# Patient Record
Sex: Female | Born: 1990 | Race: White | Hispanic: No | Marital: Single | Smoking: Former smoker
Health system: Southern US, Community
[De-identification: ages and names within clinical notes are randomized; demographics above are authoritative.]

## PROBLEM LIST (undated history)

## (undated) DIAGNOSIS — T7840XA Allergy, unspecified, initial encounter: Secondary | ICD-10-CM

## (undated) DIAGNOSIS — F419 Anxiety disorder, unspecified: Secondary | ICD-10-CM

## (undated) DIAGNOSIS — F909 Attention-deficit hyperactivity disorder, unspecified type: Secondary | ICD-10-CM

## (undated) HISTORY — DX: Attention-deficit hyperactivity disorder, unspecified type: F90.9

## (undated) HISTORY — PX: TONSILLECTOMY: SUR1361

## (undated) HISTORY — DX: Anxiety disorder, unspecified: F41.9

## (undated) HISTORY — DX: Allergy, unspecified, initial encounter: T78.40XA

---
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
## 2012-02-06 NOTE — L&D Delivery Note (Signed)
Delivery Note  SVD viable female Apgars 8,9 over intact perineum.  Placenta delivered spontaneously intact with 3VC. Repair with 2-0 Chromic of post wall vag lac with good support and hemostasis noted and R/V exam confirms.  PH art was sent.  [place] cord blood was not done.  Mother and baby were doing well.  EBL 350cc  Candice Camp, MD

## 2012-04-13 IMAGING — CR RIGHT THUMB 2+V
1 series · 3 of 3 positions shown · non-contrast
Comparison: none

REASON FOR EXAM: pain in thumb Call Report to Dr Kornuta [phone]
COMMENTS:

PROCEDURE:     DXR - DXR THUMB RIGHT HAND (1ST DIGIT)  - July 05, 2009  [DATE]
RESULT:     No fracture, dislocation or other acute bony abnormality is
identified.

[Series 1: view not recorded · 0.17mm/px · 3 of 3 slices shown]
[im 1/3]
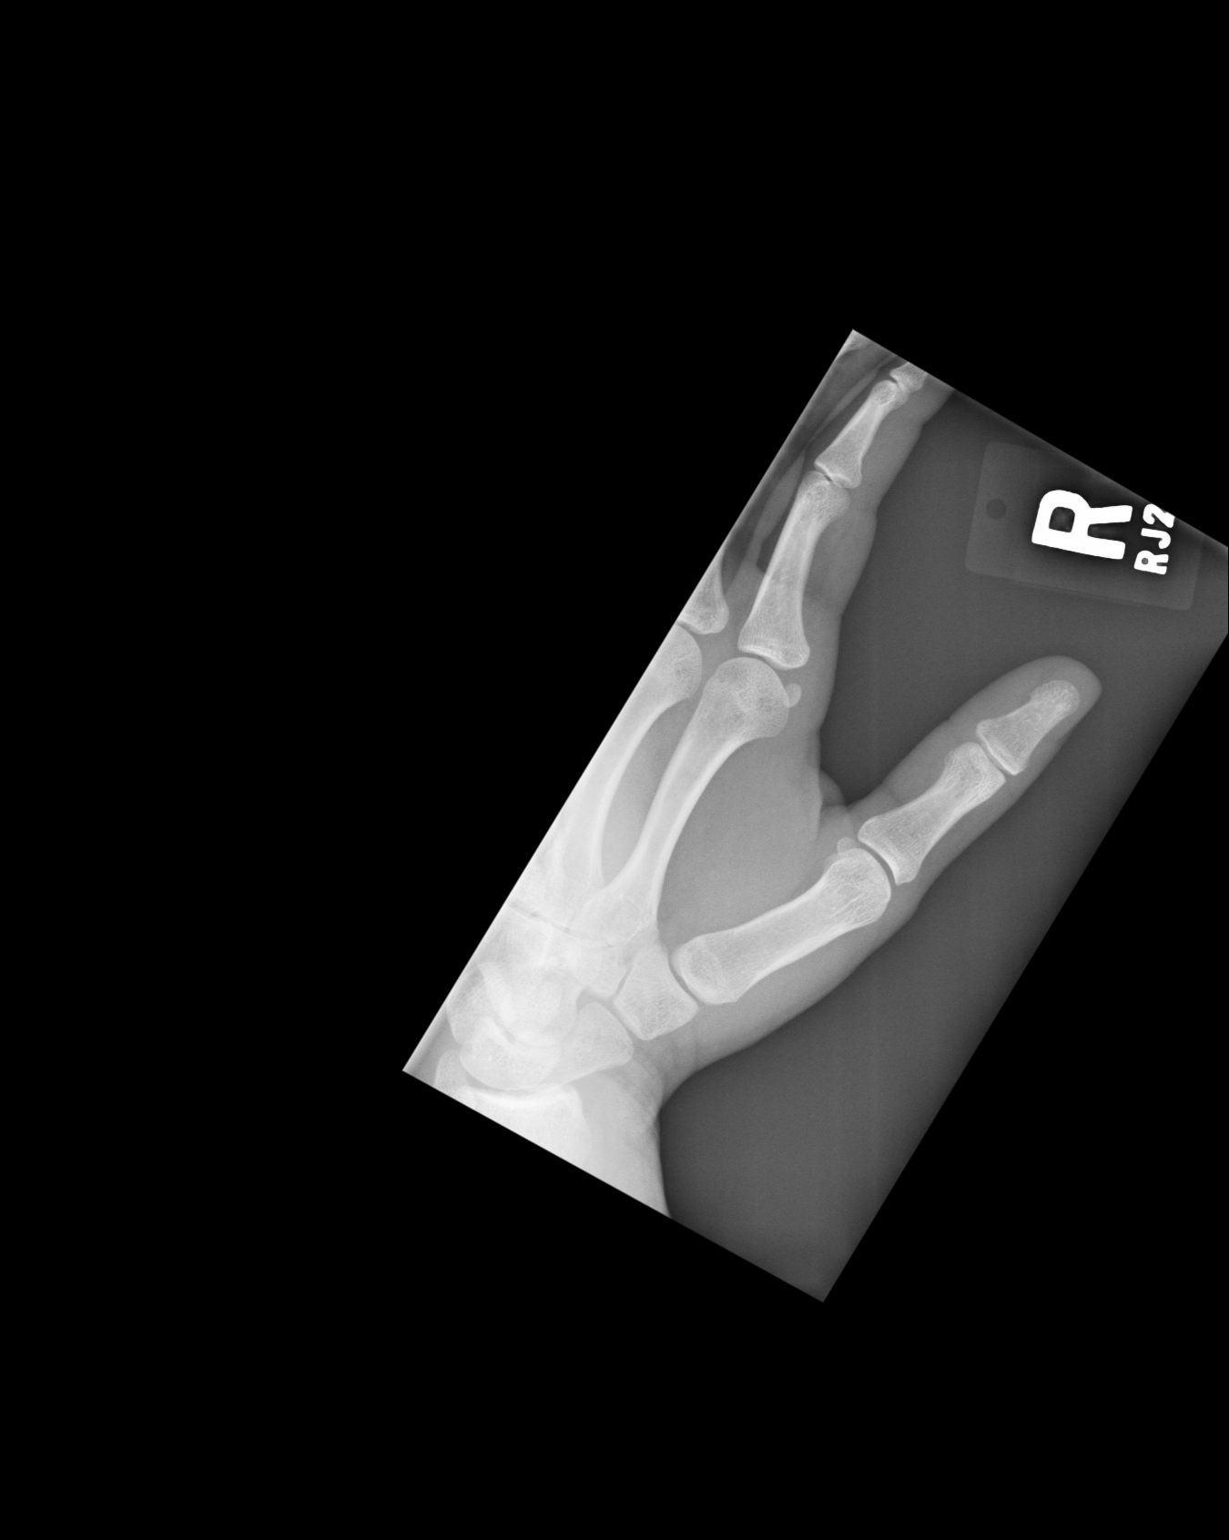
[im 2/3]
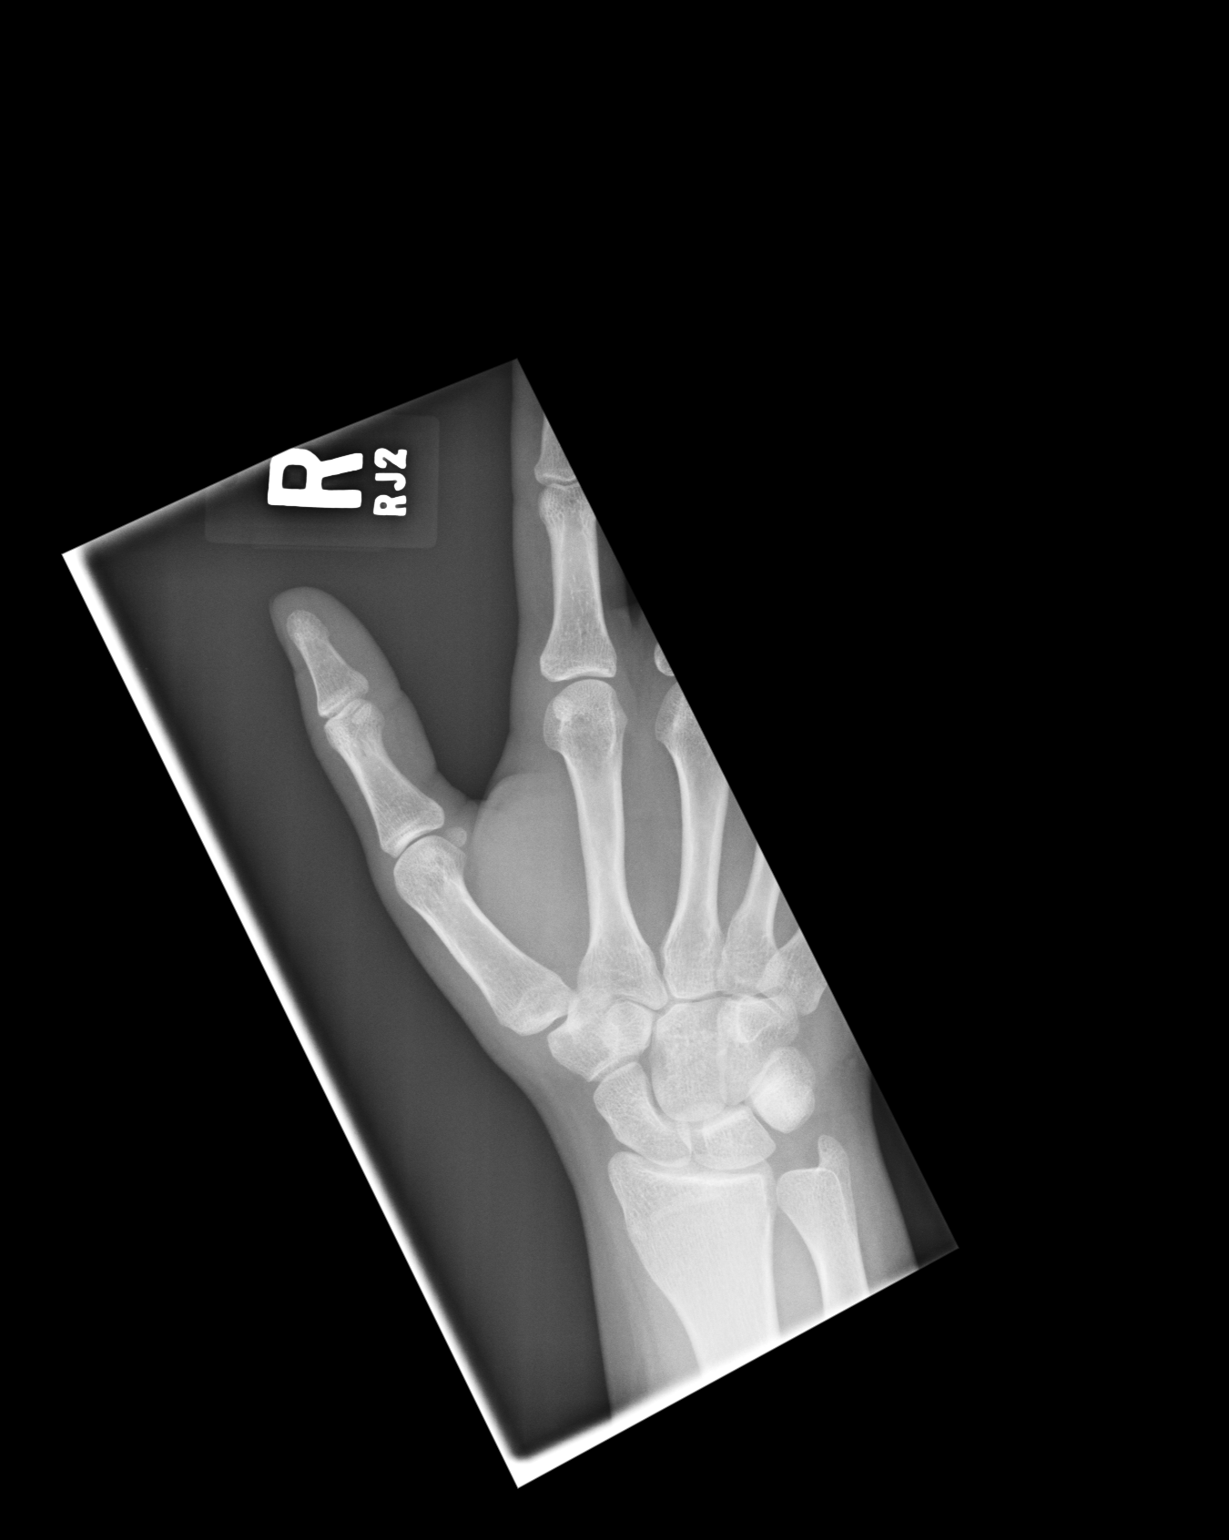
[im 3/3]
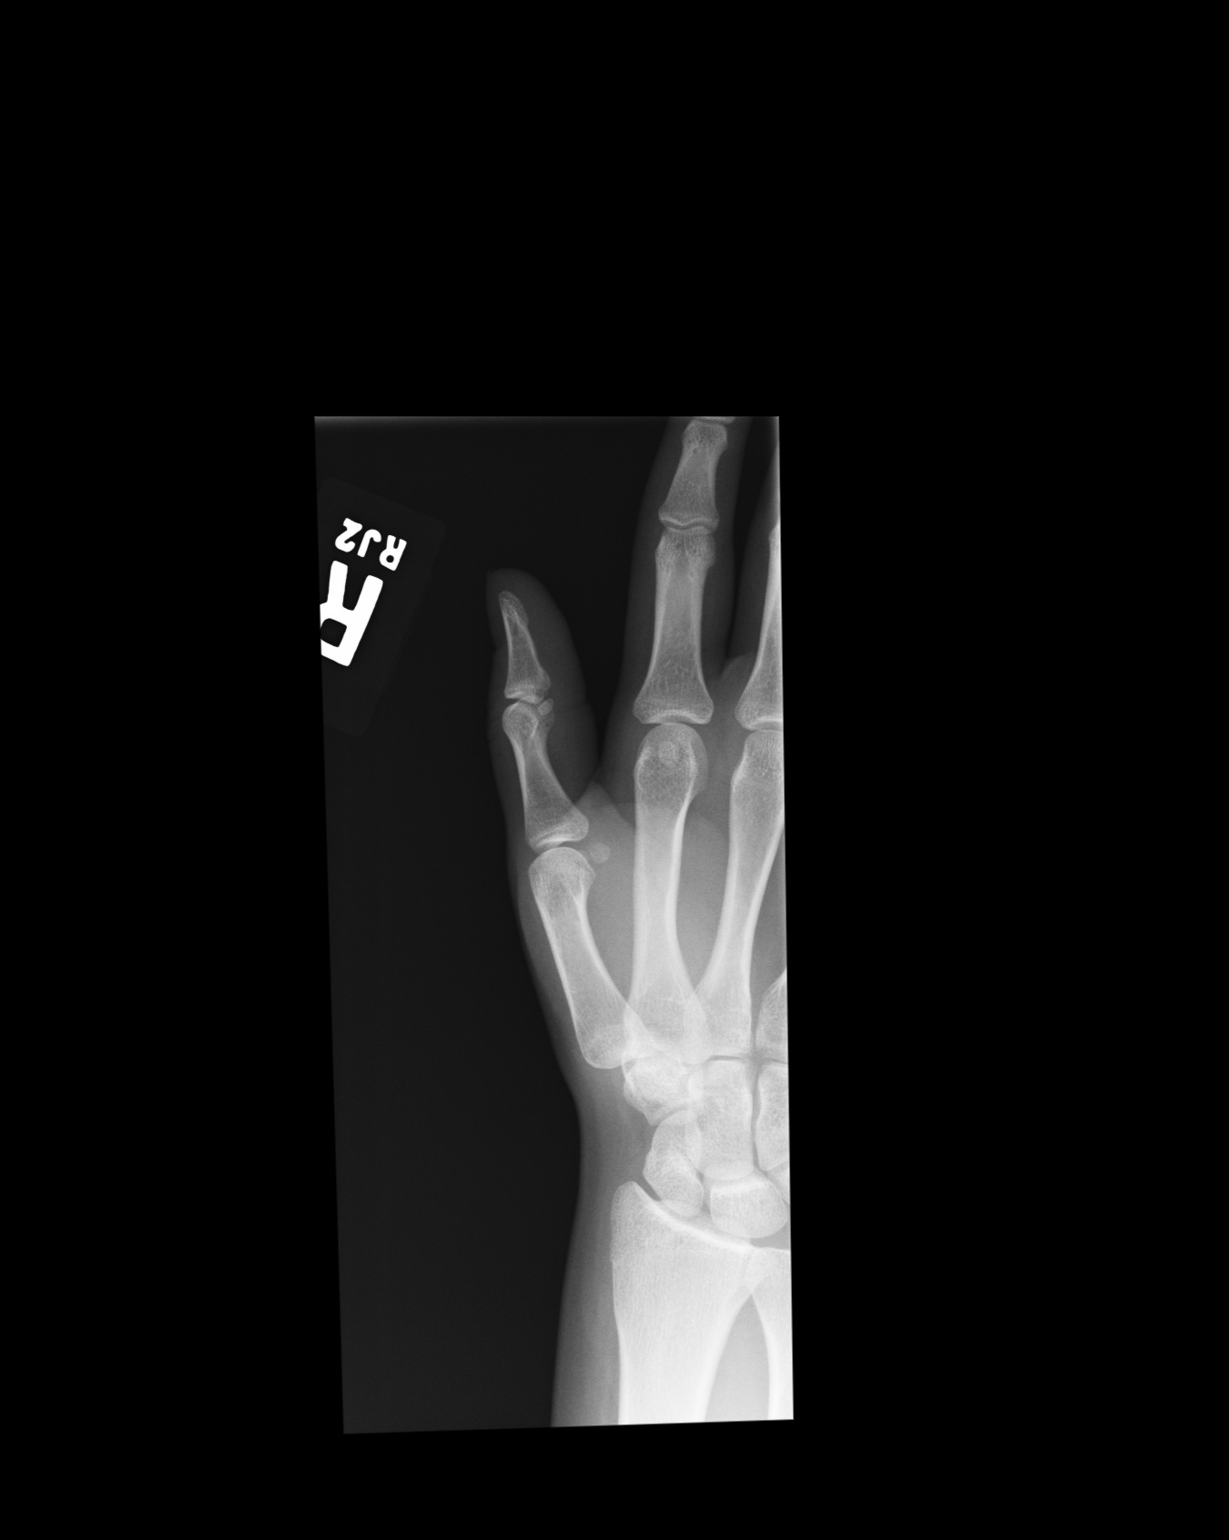

[3 of 3 positions shown; findings below may reference images not displayed]

IMPRESSION: 1. No significant osseous abnormalities are noted.
2. No soft tissue foreign body is seen area

## 2012-04-21 LAB — OB RESULTS CONSOLE GC/CHLAMYDIA
Chlamydia: NEGATIVE
Gonorrhea: NEGATIVE

## 2012-04-21 LAB — OB RESULTS CONSOLE RPR: RPR: NONREACTIVE

## 2012-04-21 LAB — OB RESULTS CONSOLE HIV ANTIBODY (ROUTINE TESTING): HIV: NONREACTIVE

## 2012-04-21 LAB — OB RESULTS CONSOLE HEPATITIS B SURFACE ANTIGEN: Hepatitis B Surface Ag: NEGATIVE

## 2012-10-27 LAB — OB RESULTS CONSOLE GBS: GBS: NEGATIVE

## 2012-11-13 ENCOUNTER — Encounter (HOSPITAL_COMMUNITY): Payer: Self-pay

## 2012-11-13 ENCOUNTER — Inpatient Hospital Stay (HOSPITAL_COMMUNITY)
Admission: AD | Admit: 2012-11-13 | Discharge: 2012-11-15 | DRG: 775 | Disposition: A | Discharge status: Home / Self Care | Payer: Medicaid Other | Source: Ambulatory Visit | Attending: Obstetrics & Gynecology | Admitting: Obstetrics & Gynecology

## 2012-11-13 DIAGNOSIS — Z87891 Personal history of nicotine dependence: Secondary | ICD-10-CM

## 2012-11-13 LAB — COMPREHENSIVE METABOLIC PANEL
ALT: 13 U/L (ref 0–35)
AST: 22 U/L (ref 0–37)
Alkaline Phosphatase: 247 U/L — ABNORMAL HIGH (ref 39–117)
CO2: 20 mEq/L (ref 19–32)
Calcium: 9.5 mg/dL (ref 8.4–10.5)
Chloride: 101 mEq/L (ref 96–112)
Creatinine, Ser: 0.66 mg/dL (ref 0.50–1.10)
GFR calc non Af Amer: >90 mL/min (ref >90)
Potassium: 4.4 mEq/L (ref 3.5–5.1)
Sodium: 136 mEq/L (ref 135–145)
Total Protein: 7.1 g/dL (ref 6.0–8.3)

## 2012-11-13 LAB — CBC
HCT: 39.2 % (ref 36.0–46.0)
MCH: 27.4 pg (ref 26.0–34.0)
MCV: 82 fL (ref 78.0–100.0)
Platelets: 205 10*3/uL (ref 150–400)
RBC: 4.78 MIL/uL (ref 3.87–5.11)
RDW: 13.6 % (ref 11.5–15.5)
WBC: 13.5 10*3/uL — ABNORMAL HIGH (ref 4.0–10.5)

## 2012-11-13 LAB — URINALYSIS, ROUTINE W REFLEX MICROSCOPIC
Bilirubin Urine: NEGATIVE
Glucose, UA: NEGATIVE mg/dL
Hgb urine dipstick: NEGATIVE
Ketones, ur: NEGATIVE mg/dL
Nitrite: NEGATIVE
Protein, ur: NEGATIVE mg/dL
Specific Gravity, Urine: 1.025 (ref 1.005–1.030)
Urobilinogen, UA: 0.2 mg/dL (ref 0.0–1.0)
pH: 6.5 (ref 5.0–8.0)

## 2012-11-13 LAB — URINE MICROSCOPIC-ADD ON

## 2012-11-13 LAB — LACTATE DEHYDROGENASE: LDH: 259 U/L — ABNORMAL HIGH (ref 94–250)

## 2012-11-13 LAB — ABO/RH: ABO/RH(D): O POS

## 2012-11-13 LAB — TYPE AND SCREEN
ABO/RH(D): O POS
Antibody Screen: NEGATIVE

## 2012-11-13 MED ORDER — OXYCODONE-ACETAMINOPHEN 5-325 MG PO TABS: 1.0000 | ORAL_TABLET | ORAL | Status: DC | PRN

## 2012-11-13 MED ORDER — OXYTOCIN 40 UNITS IN LACTATED RINGERS INFUSION - SIMPLE MED
62.5000 mL/h | INTRAVENOUS | Status: DC
  Administered 2012-11-13: 62.5 mL/h via INTRAVENOUS
  Filled 2012-11-13: qty 1000

## 2012-11-13 MED ORDER — DIPHENHYDRAMINE HCL 25 MG PO CAPS: 25.0000 mg | ORAL_CAPSULE | Freq: Four times a day (QID) | ORAL | Status: DC | PRN

## 2012-11-13 MED ORDER — SIMETHICONE 80 MG PO CHEW: 80.0000 mg | CHEWABLE_TABLET | ORAL | Status: DC | PRN

## 2012-11-13 MED ORDER — FLEET ENEMA 7-19 GM/118ML RE ENEM: 1.0000 | ENEMA | RECTAL | Status: DC | PRN

## 2012-11-13 MED ORDER — MEDROXYPROGESTERONE ACETATE 150 MG/ML IM SUSP: 150.0000 mg | INTRAMUSCULAR | Status: DC | PRN

## 2012-11-13 MED ORDER — LANOLIN HYDROUS EX OINT: TOPICAL_OINTMENT | CUTANEOUS | Status: DC | PRN

## 2012-11-13 MED ORDER — MEASLES, MUMPS & RUBELLA VAC ~~LOC~~ INJ
0.5000 mL | INJECTION | Freq: Once | SUBCUTANEOUS | Status: DC
  Filled 2012-11-13: qty 0.5

## 2012-11-13 MED ORDER — LACTATED RINGERS IV SOLN: 500.0000 mL | INTRAVENOUS | Status: DC | PRN

## 2012-11-13 MED ORDER — TETANUS-DIPHTH-ACELL PERTUSSIS 5-2.5-18.5 LF-MCG/0.5 IM SUSP: 0.5000 mL | Freq: Once | INTRAMUSCULAR | Status: DC

## 2012-11-13 MED ORDER — EPHEDRINE 5 MG/ML INJ
10.0000 mg | INTRAVENOUS | Status: DC | PRN
  Filled 2012-11-13: qty 2

## 2012-11-13 MED ORDER — PHENYLEPHRINE 40 MCG/ML (10ML) SYRINGE FOR IV PUSH (FOR BLOOD PRESSURE SUPPORT)
80.0000 ug | PREFILLED_SYRINGE | INTRAVENOUS | Status: DC | PRN
  Filled 2012-11-13: qty 2

## 2012-11-13 MED ORDER — DIBUCAINE 1 % RE OINT: 1.0000 "application " | TOPICAL_OINTMENT | RECTAL | Status: DC | PRN

## 2012-11-13 MED ORDER — ONDANSETRON HCL 4 MG PO TABS: 4.0000 mg | ORAL_TABLET | ORAL | Status: DC | PRN

## 2012-11-13 MED ORDER — DIPHENHYDRAMINE HCL 50 MG/ML IJ SOLN: 12.5000 mg | INTRAMUSCULAR | Status: DC | PRN

## 2012-11-13 MED ORDER — LIDOCAINE HCL (PF) 1 % IJ SOLN
30.0000 mL | INTRAMUSCULAR | Status: DC | PRN
  Filled 2012-11-13 (×2): qty 30

## 2012-11-13 MED ORDER — ZOLPIDEM TARTRATE 5 MG PO TABS: 5.0000 mg | ORAL_TABLET | Freq: Every evening | ORAL | Status: DC | PRN

## 2012-11-13 MED ORDER — ACETAMINOPHEN 325 MG PO TABS: 650.0000 mg | ORAL_TABLET | ORAL | Status: DC | PRN

## 2012-11-13 MED ORDER — ONDANSETRON HCL 4 MG/2ML IJ SOLN: 4.0000 mg | INTRAMUSCULAR | Status: DC | PRN

## 2012-11-13 MED ORDER — LACTATED RINGERS IV SOLN: 500.0000 mL | Freq: Once | INTRAVENOUS | Status: DC

## 2012-11-13 MED ORDER — OXYTOCIN BOLUS FROM INFUSION: 500.0000 mL | INTRAVENOUS | Status: DC

## 2012-11-13 MED ORDER — WITCH HAZEL-GLYCERIN EX PADS: 1.0000 "application " | MEDICATED_PAD | CUTANEOUS | Status: DC | PRN

## 2012-11-13 MED ORDER — SENNOSIDES-DOCUSATE SODIUM 8.6-50 MG PO TABS
2.0000 | ORAL_TABLET | ORAL | Status: DC
  Administered 2012-11-13 – 2012-11-14 (×2): 2 via ORAL
  Filled 2012-11-13 (×2): qty 2

## 2012-11-13 MED ORDER — EPHEDRINE 5 MG/ML INJ
10.0000 mg | INTRAVENOUS | Status: DC | PRN
  Filled 2012-11-13: qty 4
  Filled 2012-11-13: qty 2

## 2012-11-13 MED ORDER — PRENATAL MULTIVITAMIN CH
1.0000 | ORAL_TABLET | Freq: Every day | ORAL | Status: DC
  Administered 2012-11-14 – 2012-11-15 (×2): 1 via ORAL
  Filled 2012-11-13 (×2): qty 1

## 2012-11-13 MED ORDER — LACTATED RINGERS IV SOLN
INTRAVENOUS | Status: DC
  Administered 2012-11-13: 06:00:00 via INTRAVENOUS

## 2012-11-13 MED ORDER — ONDANSETRON HCL 4 MG/2ML IJ SOLN: 4.0000 mg | Freq: Four times a day (QID) | INTRAMUSCULAR | Status: DC | PRN

## 2012-11-13 MED ORDER — IBUPROFEN 600 MG PO TABS
600.0000 mg | ORAL_TABLET | Freq: Four times a day (QID) | ORAL | Status: DC
  Administered 2012-11-13 – 2012-11-15 (×8): 600 mg via ORAL
  Filled 2012-11-13 (×9): qty 1

## 2012-11-13 MED ORDER — FENTANYL 2.5 MCG/ML BUPIVACAINE 1/10 % EPIDURAL INFUSION (WH - ANES)
14.0000 mL/h | INTRAMUSCULAR | Status: DC | PRN
  Administered 2012-11-13: 14 mL/h via EPIDURAL
  Filled 2012-11-13 (×2): qty 125

## 2012-11-13 MED ORDER — BENZOCAINE-MENTHOL 20-0.5 % EX AERO
1.0000 "application " | INHALATION_SPRAY | CUTANEOUS | Status: DC | PRN
  Administered 2012-11-14: 1 via TOPICAL
  Filled 2012-11-13: qty 56

## 2012-11-13 MED ORDER — IBUPROFEN 600 MG PO TABS: 600.0000 mg | ORAL_TABLET | Freq: Four times a day (QID) | ORAL | Status: DC | PRN

## 2012-11-13 MED ORDER — CITRIC ACID-SODIUM CITRATE 334-500 MG/5ML PO SOLN: 30.0000 mL | ORAL | Status: DC | PRN

## 2012-11-13 MED ORDER — PHENYLEPHRINE 40 MCG/ML (10ML) SYRINGE FOR IV PUSH (FOR BLOOD PRESSURE SUPPORT)
80.0000 ug | PREFILLED_SYRINGE | INTRAVENOUS | Status: DC | PRN
  Filled 2012-11-13: qty 2
  Filled 2012-11-13: qty 5

## 2012-11-13 NOTE — H&P (Signed)
Felicia Shepherd is a 22 y.o. female presenting for labor.  CTX began at 0030 today.  No LOF or VB.  +FM.  GBS negative.  No antepartum complications.  Comfortable with epidural.  Maternal Medical History:  Reason for admission: Contractions.   Contractions: Onset was 6-12 hours ago.   Frequency: regular.   Perceived severity is moderate.    Fetal activity: Perceived fetal activity is normal.   Last perceived fetal movement was within the past hour.    Prenatal complications: no prenatal complications Prenatal Complications - Diabetes: none.    OB History   Grav Para Term Preterm Abortions TAB SAB Ect Mult Living   1              History reviewed. No pertinent past medical history. Past Surgical History  Procedure Laterality Date  . Tonsillectomy     Family History: family history is not on file. Social History:  reports that she has quit smoking. She does not have any smokeless tobacco history on file. She reports that she does not drink alcohol or use illicit drugs.   Prenatal Transfer Tool  Maternal Diabetes: No Genetic Screening: Normal Maternal Ultrasounds/Referrals: Normal Fetal Ultrasounds or other Referrals:  None Maternal Substance Abuse:  No Significant Maternal Medications:  None Significant Maternal Lab Results:  Lab values include: Group B Strep negative Other Comments:  None  ROS  Dilation: 7 Effacement (%): 80 Station: -2;-1 Exam by:: LCarpenter,Rn Blood pressure 133/74, pulse 78, temperature 98.2 F (36.8 C), temperature source Oral, resp. rate 18, height 5\' 1"  (1.549 m), weight 207 lb 6.4 oz (94.076 kg), SpO2 100.00%. Maternal Exam:  Uterine Assessment: Contraction strength is moderate.  Contraction frequency is regular.   Abdomen: Patient reports no abdominal tenderness. Fundal height is c/w dates.   Estimated fetal weight is 7#8.   Fetal presentation: vertex  Introitus: Normal vulva. Normal vagina.  Pelvis: adequate for delivery.   Cervix: Cervix  evaluated by digital exam.     Physical Exam  Constitutional: She is oriented to person, place, and time. She appears well-developed and well-nourished.  GI: Soft. There is no rebound and no guarding.  Neurological: She is alert and oriented to person, place, and time.  Skin: Skin is warm and dry.  Psychiatric: She has a normal mood and affect. Her behavior is normal.    Prenatal labs: ABO, Rh: O/Positive/-- (03/17 0000) Antibody: Negative (03/17 0000) Rubella: Immune (03/17 0000) RPR: Nonreactive (03/17 0000)  HBsAg: Negative (03/17 0000)  HIV: Non-reactive (03/17 0000)  GBS:     Assessment/Plan: 22yo G1 at [redacted]w[redacted]d with labor -GBS negative -Anticipate SVD   Felicia Shepherd 11/13/2012, 6:33 AM

## 2012-11-13 NOTE — MAU Note (Signed)
Contractions every 4-6 mins since 930 pm tonight. Denies LOF or VB. Positive fetal movement. Was dilated 2.5 cm in office on Monday.

## 2012-11-13 NOTE — Progress Notes (Signed)
Epidural catheter removed intact with blue tip noted; pt tolerated well.  Bandaid applied to site, which is clean and unremarkable.

## 2012-11-13 NOTE — MAU Note (Signed)
SAYS SHE STARTED HURT BAD AT 0030.  VE IN OFFICE ON Monday-   2-3 CM- DR HOLLAND.     DENIES HSV AND  MRSA.

## 2012-11-13 NOTE — Progress Notes (Signed)
Called to see patient secondary to severe variable decelerations x 3 despite repositioning.  AROM with clear fluid.  SVE 7/90/0.  IUPC and FSE placed.  Improvement in FHT.  Will closely monitor.

## 2012-11-14 ENCOUNTER — Encounter (HOSPITAL_COMMUNITY): Payer: Self-pay | Admitting: Anesthesiology

## 2012-11-14 LAB — CBC
HCT: 34.6 % — ABNORMAL LOW (ref 36.0–46.0)
MCH: 27.6 pg (ref 26.0–34.0)
MCHC: 33.2 g/dL (ref 30.0–36.0)
MCV: 83 fL (ref 78.0–100.0)
RBC: 4.17 MIL/uL (ref 3.87–5.11)
RDW: 13.9 % (ref 11.5–15.5)

## 2012-11-14 NOTE — Progress Notes (Signed)
Post Partum Day 1 Subjective: no complaints, up ad lib, voiding and baby in NICU for hypoglycemia  Objective: Blood pressure 130/74, pulse 71, temperature 98.2 F (36.8 C), temperature source Oral, resp. rate 20, height 5\' 1"  (1.549 m), weight 207 lb 6.4 oz (94.076 kg), SpO2 100.00%, unknown if currently breastfeeding.  Physical Exam:  General: alert and cooperative Lochia: appropriate Uterine Fundus: firm Incision: perineum intact DVT Evaluation: No evidence of DVT seen on physical exam. Negative Homan's sign. No cords or calf tenderness. No significant calf/ankle edema.   Recent Labs  11/13/12 0300 11/14/12 0525  HGB 13.1 11.5*  HCT 39.2 34.6*    Assessment/Plan: Plan for discharge tomorrow and Circumcision prior to discharge   LOS: 1 day   Terral Cooks G 11/14/2012, 9:13 AM

## 2012-11-14 NOTE — Anesthesia Postprocedure Evaluation (Signed)
  Anesthesia Post-op Note  Patient: Felicia Shepherd  Procedure(s) Performed: * No procedures listed *  Patient Location: Mother/Baby  Anesthesia Type:Epidural  Level of Consciousness: awake, alert , oriented and patient cooperative  Airway and Oxygen Therapy: Patient Spontanous Breathing  Post-op Pain: mild  Post-op Assessment: Patient's Cardiovascular Status Stable and Respiratory Function Stable  Post-op Vital Signs: stable  Complications: No apparent anesthesia complications

## 2012-11-14 NOTE — Lactation Note (Addendum)
This note was copied from the chart of Felicia Shepherd. Lactation Consultation Note   Initial consult with this mom and baby, in NICU, at 24 hours post partum. Mom has been pumping every 3 hours, and expressing 1-2 mls each time(colostru). I assisted her with latching  Mosetta Putt for the first time, in cross cradle hold. He was fussy, and mom's nipples were inverting with compression, and too large for baby's mouth. I then tried a 20 nipple shield, which is small for mom, but allowed the baby to latch, and had some intermittent suckles. He transferred 1 ml of EBM that I placed in the shield. I shoed mom how to apply the nipple shield, and gave her an extra 20 and also a 24. The 24 fits mom wel, but I think will gag Mosetta Putt. Mom is gaing to try and latch without a shield first.     I later worked with mom in her hospital room. I reviewed the NICU booklet with her, loaned her a [employer] DEP and instructed her in it's use, reviewed hand expression, increased her to 27 flanges, and gave mom comfort gels for tender, but intact, nipples. I reviewed part care with mom, and showed her how to use the [employer] DEP for home. I also gave mom a manual hand pump, and instructed her in it's use. I advised mom to use this prior to latching Mosetta Putt, to better evert her nipples. Mom knows to call for questions/concerns, and to call [facility] for an appointment to get a DEP next weeks.  Patient Name: Felicia Abryanna Musolino [id number] Date: 11/14/2012 Reason for consult: Initial assessment;NICU baby;Infant < 6lbs (SGA term baby)   Maternal Data Formula Feeding for Exclusion: Yes (baby in NICU) Infant to breast within first hour of birth: Yes Has patient been taught Hand Expression?: Yes Does the patient have breastfeeding experience prior to this delivery?: No  Feeding Feeding Type: Breast Milk Nipple Type: Slow - flow Length of feed: 15 min  LATCH Score/Interventions Latch: Repeated attempts needed to sustain latch, nipple held in mouth  throughout feeding, stimulation needed to elicit sucking reflex. Intervention(s): Adjust position;Assist with latch (20 nipple shiled used to maintain and initiate suckles)  Audible Swallowing: None  Type of Nipple: Flat (were flattening with conpressionm firm tissue,large for baby's mouth) Intervention(s): Double electric pump;Hand pump  Comfort (Breast/Nipple): Filling, red/small blisters or bruises, mild/mod discomfort  Problem noted: Mild/Moderate discomfort Interventions (Mild/moderate discomfort): Comfort gels  Hold (Positioning): Assistance needed to correctly position infant at breast and maintain latch.  LATCH Score: 4  Lactation Tools Discussed/Used Tools: Nipple Shields Nipple shield size: 20;24 WIC Program: Yes Pump Review: Setup, frequency, and cleaning;Milk Storage;Other (comment) (hand expression, NICU booklet on EBm in NICU) Initiated by:: bedside rn within 6 hours of baby going to NICU Date initiated:: 11/13/12   Consult Status Consult Status: Follow-up Date: 11/15/12 Follow-up type: In-patient    Alfred Levins 11/14/2012, 6:22 PM

## 2012-11-15 MED ORDER — IBUPROFEN 600 MG PO TABS: 600.0000 mg | ORAL_TABLET | Freq: Four times a day (QID) | ORAL | Status: DC

## 2012-11-15 NOTE — Discharge Summary (Signed)
Obstetric Discharge Summary Reason for Admission: onset of labor Prenatal Procedures: ultrasound Intrapartum Procedures: spontaneous vaginal delivery Postpartum Procedures: none Complications-Operative and Postpartum: none Hemoglobin  Date Value Range Status  11/14/2012 11.5* 12.0 - 15.0 g/dL Final     HCT  Date Value Range Status  11/14/2012 34.6* 36.0 - 46.0 % Final    Physical Exam:  General: alert and cooperative Lochia: appropriate Uterine Fundus: firm Incision: n/a DVT Evaluation: No evidence of DVT seen on physical exam.  Discharge Diagnoses: Preterm delivery  Discharge Information: Date: 11/15/2012 Activity: pelvic rest Diet: routine Medications: PNV and Ibuprofen Condition: stable Instructions: refer to practice specific booklet Discharge to: home Follow-up Information   Schedule an appointment as soon as possible for a visit in 6 weeks to follow up.      Newborn Data: Live born female  Birth Weight: 5 lb 7.3 oz (2475 g) APGAR: 8, 9  Home with mother.  Felicia Shepherd 11/15/2012, 10:45 AM

## 2012-11-18 ENCOUNTER — Ambulatory Visit: Payer: Self-pay

## 2012-11-18 NOTE — Lactation Note (Signed)
This note was copied from the chart of Felicia Shepherd. Lactation Consultation Note      Follow up brief consult with this mom of a NICU baby, term at 31 3/[redacted] weeks gestation , but small at 5 1/2 pounds. He is taking all his milk by bottle now, and should be discharged to home in the next few days. Mom does not want to try latching now - just pump and bottle, but knows to call for an outpatient consult when she decides to try latching him. Mom reports some nipple tenderness at the end of pumping. I gave her size 30 flanges and adivsed her to try them. Mom knows to call for questions/concerns.  Patient Name: Felicia Gayleen Sholtz [id number] Date: 11/18/2012 Reason for consult: Follow-up assessment;NICU baby   Maternal Data    Feeding Feeding Type: Bottle Fed - Breast Milk Nipple Type: Slow - flow Length of feed: 20 min  LATCH Score/Interventions                      Lactation Tools Discussed/Used Tools: Flanges Flange Size: 30 (increased mom to 30's)   Consult Status Consult Status: PRN Follow-up type:  (in NICU)    Alfred Levins 11/18/2012, 11:06 AM

## 2012-11-20 ENCOUNTER — Ambulatory Visit: Payer: Self-pay

## 2012-11-20 NOTE — Lactation Note (Signed)
This note was copied from the chart of Felicia Shepherd. Lactation Consultation Note     Follow up consult with this mom and baby, now 7 days and 39 5/7 weeks corrected gestation.  I assisted mom with latching Mosetta Putt. I tried at first in football hold, without nipple shiled He would latch but not maintain. Strong cues, obviously hungry. I then tried a 24 nipple shiled with SNS under the shield. He now suckled well intermittently, with audible swallows but was sleepy . I told mom she can do this once home as often during the day as she and baby have the time and energy for. I told her to continue pumping, to comfort now, and that shw eill still probably be mostly bottle feeding at first. Mom will call early next week to schedule an outpatient   Patient Name: Felicia Kamara [name] Date: 11/20/2012 Reason for consult: Follow-up assessment;NICU baby   Maternal Data    Feeding Feeding Type: Breast Fed Nipple Type: Slow - flow Length of feed: 5 min  LATCH Score/Interventions Latch: Repeated attempts needed to sustain latch, nipple held in mouth throughout feeding, stimulation needed to elicit sucking reflex. (24 nipple shile with SNS under shield with EBM was used to maintain latch and eliciit suck) Intervention(s): Skin to skin;Teach feeding cues;Waking techniques Intervention(s): Adjust position;Assist with latch;Breast massage;Breast compression  Audible Swallowing: Spontaneous and intermittent Intervention(s): Skin to skin;Hand expression  Type of Nipple: Everted at rest and after stimulation  Comfort (Breast/Nipple): Soft / non-tender     Hold (Positioning): Assistance needed to correctly position infant at breast and maintain latch. Intervention(s): Breastfeeding basics reviewed;Support Pillows;Position options;Skin to skin  LATCH Score: 8  Lactation Tools Discussed/Used Nipple shield size: 24 (showed mom how to apply)   Consult Status Consult Status: Follow-up Follow-up  type:  (prn in NICU)    Alfred Levins 11/20/2012, 4:13 PM

## 2013-12-07 ENCOUNTER — Encounter (HOSPITAL_COMMUNITY): Payer: Self-pay

## 2014-08-17 ENCOUNTER — Ambulatory Visit: Payer: Self-pay | Admitting: Family Medicine

## 2014-10-05 ENCOUNTER — Other Ambulatory Visit: Payer: Self-pay | Admitting: Family Medicine

## 2014-10-05 ENCOUNTER — Ambulatory Visit: Payer: Self-pay | Admitting: Family Medicine

## 2014-10-05 MED ORDER — AMPHETAMINE-DEXTROAMPHETAMINE 5 MG PO TABS: 5.0000 mg | ORAL_TABLET | Freq: Every day | ORAL | Status: DC

## 2014-10-05 MED ORDER — AMPHETAMINE-DEXTROAMPHETAMINE 10 MG PO TABS: 10.0000 mg | ORAL_TABLET | Freq: Every morning | ORAL | Status: DC

## 2014-10-05 NOTE — Telephone Encounter (Signed)
Medication has been refilled and patient has picked up prescription

## 2014-10-12 ENCOUNTER — Encounter: Payer: Self-pay | Admitting: Family Medicine

## 2014-10-12 ENCOUNTER — Ambulatory Visit (INDEPENDENT_AMBULATORY_CARE_PROVIDER_SITE_OTHER): Payer: 59 | Admitting: Family Medicine

## 2014-10-12 VITALS — BP 120/73 | HR 75 | Temp 98.5°F | Resp 18 | Ht 63.0 in | Wt 168.4 lb

## 2014-10-12 DIAGNOSIS — F900 Attention-deficit hyperactivity disorder, predominantly inattentive type: Secondary | ICD-10-CM

## 2014-10-12 DIAGNOSIS — Z134 Encounter for screening for unspecified developmental delays: Secondary | ICD-10-CM | POA: Insufficient documentation

## 2014-10-12 DIAGNOSIS — Z1389 Encounter for screening for other disorder: Secondary | ICD-10-CM | POA: Insufficient documentation

## 2014-10-12 MED ORDER — AMPHETAMINE-DEXTROAMPHETAMINE 5 MG PO TABS: 5.0000 mg | ORAL_TABLET | Freq: Every day | ORAL | Status: DC

## 2014-10-12 MED ORDER — AMPHETAMINE-DEXTROAMPHETAMINE 10 MG PO TABS: 10.0000 mg | ORAL_TABLET | Freq: Every morning | ORAL | Status: DC

## 2014-10-12 NOTE — Progress Notes (Signed)
Name: Felicia Shepherd   MRN: [id number]    DOB: September 01, 1990   Date:10/12/2014       Progress Note  Subjective  Chief Complaint  Chief Complaint  Patient presents with  . Medication Refill    Adderall 5 mg  . ADHD    HPI Pt. Is here for Attention Deficit Disorder evaluation and  medication refill. She has difficulty focusing and completing tasks and works as a Runner, broadcasting/film/video in [place]. She is on Adderall 10 mg in AM and 5 mg in PM, which seems to help with above-mentioned symptoms. No reported side effects.  Past Medical History  Diagnosis Date  . Allergy     Past Surgical History  Procedure Laterality Date  . Tonsillectomy    . Tonsillectomy  2009  . Adenoidectomy  2009    Family History  Problem Relation Age of Onset  . Diabetes Maternal Grandmother   . Diabetes Maternal Grandfather   . Heart attack Maternal Grandfather     Social History   Social History  . Marital Status: Single    Spouse Name: N/A  . Number of Children: N/A  . Years of Education: N/A   Occupational History  . Not on file.   Social History Main Topics  . Smoking status: Former Games developer  . Smokeless tobacco: Not on file  . Alcohol Use: No  . Drug Use: No  . Sexual Activity: Not on file   Other Topics Concern  . Not on file   Social History Narrative     Current outpatient prescriptions:  .  amphetamine-dextroamphetamine (ADDERALL) 10 MG tablet, Take 1 tablet (10 mg total) by mouth every morning., Disp: 10 tablet, Rfl: 0 .  amphetamine-dextroamphetamine (ADDERALL) 5 MG tablet, Take 1 tablet (5 mg total) by mouth daily at 2 PM daily at 2 PM., Disp: 10 tablet, Rfl: 0 .  ibuprofen (ADVIL,MOTRIN) 600 MG tablet, Take 1 tablet (600 mg total) by mouth every 6 (six) hours., Disp: 30 tablet, Rfl: 0 .  Multiple Vitamin (MULTIVITAMIN) tablet, Take 1 tablet by mouth daily., Disp: , Rfl:   Allergies  Allergen Reactions  . Keflex [Cephalexin] Hives and Other (See Comments)     Review of Systems   Constitutional: Negative for weight loss.  Psychiatric/Behavioral: The patient is not nervous/anxious and does not have insomnia.    Objective  Filed Vitals:   10/12/14 0922  BP: 120/73  Pulse: 75  Temp: 98.5 F (36.9 C)  TempSrc: Oral  Resp: 18  Height:  (1.6 m)  Weight: 168 lb 6.4 oz (76.386 kg)  SpO2: 98%    Physical Exam  Constitutional: She is oriented to person, place, and time and well-developed, well-nourished, and in no distress.  Cardiovascular: Normal rate and regular rhythm.   Pulmonary/Chest: Effort normal and breath sounds normal.  Neurological: She is alert and oriented to person, place, and time.  Psychiatric: Memory, affect and judgment normal.  Nursing note and vitals reviewed.  Assessment & Plan  1. Attention-deficit hyperactivity disorder, predominantly inattentive type Refills for Adderall provided. Patient is doing well on the current dosage and with no apparent adverse effects. Follow-up in 3 months. - amphetamine-dextroamphetamine (ADDERALL) 5 MG tablet; Take 1 tablet (5 mg total) by mouth daily at 2 PM daily at 2 PM.  Dispense: 30 tablet; Refill: 0 - amphetamine-dextroamphetamine (ADDERALL) 10 MG tablet; Take 1 tablet (10 mg total) by mouth every morning.  Dispense: 30 tablet; Refill: 0 - amphetamine-dextroamphetamine (ADDERALL) 5 MG tablet; Take  1 tablet (5 mg total) by mouth daily at 2 PM daily at 2 PM.  Dispense: 30 tablet; Refill: 0   Coda Mathey Asad A. Fay[facility] 10/12/2014 9:50 AM

## 2015-01-11 ENCOUNTER — Ambulatory Visit: Payer: 59 | Admitting: Family Medicine

## 2015-03-10 ENCOUNTER — Ambulatory Visit (INDEPENDENT_AMBULATORY_CARE_PROVIDER_SITE_OTHER): Payer: 59 | Admitting: Family Medicine

## 2015-03-10 ENCOUNTER — Encounter: Payer: Self-pay | Admitting: Family Medicine

## 2015-03-10 VITALS — BP 112/74 | HR 93 | Temp 98.3°F | Resp 14 | Ht 63.0 in | Wt 182.6 lb

## 2015-03-10 DIAGNOSIS — F900 Attention-deficit hyperactivity disorder, predominantly inattentive type: Secondary | ICD-10-CM

## 2015-03-10 NOTE — Progress Notes (Signed)
Name: Felicia Shepherd   MRN: [id number]    DOB: 01-26-1991   Date:03/10/2015       Progress Note  Subjective  Chief Complaint  Chief Complaint  Patient presents with  . Medication Refill    Insurance will not cover adderall needs referral    HPI  Pt. Is here for referral to Psychiatry for evaluation of Attention Deficit Disorder. She has history of ADHD, symptoms include forgetfulness, trouble concentrating and completing tasks on time. Symptoms relieved with Adderall 10 mg in AM and 5 mg in the afternoon. Her insurance company has denied coverage for Adderall pending consultation with mental health specialist.   Past Medical History  Diagnosis Date  . Allergy     Past Surgical History  Procedure Laterality Date  . Tonsillectomy    . Tonsillectomy  2009  . Adenoidectomy  2009    Family History  Problem Relation Age of Onset  . Diabetes Maternal Grandmother   . Diabetes Maternal Grandfather   . Heart attack Maternal Grandfather     Social History   Social History  . Marital Status: Single    Spouse Name: N/A  . Number of Children: N/A  . Years of Education: N/A   Occupational History  . Not on file.   Social History Main Topics  . Smoking status: Former Games developer  . Smokeless tobacco: Not on file  . Alcohol Use: No  . Drug Use: No  . Sexual Activity: Not on file   Other Topics Concern  . Not on file   Social History Narrative     Current outpatient prescriptions:  .  amphetamine-dextroamphetamine (ADDERALL) 10 MG tablet, Take 1 tablet (10 mg total) by mouth every morning., Disp: 30 tablet, Rfl: 0 .  amphetamine-dextroamphetamine (ADDERALL) 5 MG tablet, Take 1 tablet (5 mg total) by mouth daily at 2 PM daily at 2 PM., Disp: 30 tablet, Rfl: 0 .  amphetamine-dextroamphetamine (ADDERALL) 5 MG tablet, Take 1 tablet (5 mg total) by mouth daily at 2 PM daily at 2 PM., Disp: 30 tablet, Rfl: 0 .  ibuprofen (ADVIL,MOTRIN) 600 MG tablet, Take 1 tablet (600 mg total) by  mouth every 6 (six) hours., Disp: 30 tablet, Rfl: 0 .  Multiple Vitamin (MULTIVITAMIN) tablet, Take 1 tablet by mouth daily., Disp: , Rfl:   Allergies  Allergen Reactions  . Keflex [Cephalexin] Hives and Other (See Comments)     Review of Systems  Respiratory: Negative for shortness of breath.   Cardiovascular: Negative for chest pain.  Gastrointestinal: Negative for abdominal pain.  Neurological: Positive for headaches.      Objective  Filed Vitals:   03/10/15 1627  BP: 112/74  Pulse: 93  Temp: 98.3 F (36.8 C)  TempSrc: Oral  Resp: 14  Height:  (1.6 m)  Weight: 182 lb 9.6 oz (82.827 kg)  SpO2: 97%    Physical Exam  Constitutional: She is oriented to person, place, and time and well-developed, well-nourished, and in no distress.  HENT:  Head: Normocephalic and atraumatic.  Cardiovascular: Normal rate and regular rhythm.   Pulmonary/Chest: Effort normal and breath sounds normal.  Neurological: She is alert and oriented to person, place, and time.  Nursing note and vitals reviewed.      Assessment & Plan  1. Attention-deficit hyperactivity disorder, predominantly inattentive type  - Ambulatory referral to Psychiatry   Trinette Vera Asad A. Faylene [facility] 03/10/2015 4:35 PM

## 2015-04-27 ENCOUNTER — Ambulatory Visit: Payer: 59 | Admitting: Psychiatry

## 2015-05-09 ENCOUNTER — Encounter: Payer: Self-pay | Admitting: Psychiatry

## 2015-05-09 ENCOUNTER — Ambulatory Visit (INDEPENDENT_AMBULATORY_CARE_PROVIDER_SITE_OTHER): Payer: 59 | Admitting: Psychiatry

## 2015-05-09 VITALS — BP 122/80 | HR 88 | Temp 98.0°F | Ht 63.0 in | Wt 186.0 lb

## 2015-05-09 DIAGNOSIS — IMO0002 Reserved for concepts with insufficient information to code with codable children: Secondary | ICD-10-CM | POA: Insufficient documentation

## 2015-05-09 DIAGNOSIS — F988 Other specified behavioral and emotional disorders with onset usually occurring in childhood and adolescence: Secondary | ICD-10-CM

## 2015-05-09 DIAGNOSIS — Z87898 Personal history of other specified conditions: Secondary | ICD-10-CM | POA: Insufficient documentation

## 2015-05-09 DIAGNOSIS — Z23 Encounter for immunization: Secondary | ICD-10-CM | POA: Insufficient documentation

## 2015-05-09 DIAGNOSIS — F909 Attention-deficit hyperactivity disorder, unspecified type: Secondary | ICD-10-CM

## 2015-05-09 DIAGNOSIS — F411 Generalized anxiety disorder: Secondary | ICD-10-CM | POA: Diagnosis not present

## 2015-05-09 MED ORDER — METHYLPHENIDATE HCL ER (OSM) 27 MG PO TBCR: 27.0000 mg | EXTENDED_RELEASE_TABLET | ORAL | Status: DC

## 2015-05-09 MED ORDER — ESCITALOPRAM OXALATE 10 MG PO TABS: 10.0000 mg | ORAL_TABLET | Freq: Every day | ORAL | Status: DC

## 2015-05-09 NOTE — Progress Notes (Signed)
Psychiatric Initial Adult Assessment   Patient Identification: Felicia Shepherd MRN:  [id number] Date of Evaluation:  05/09/2015 Referral Source: Brayton ElSyed Shah, M.D Chief Complaint:   Chief Complaint    Establish Care; Anxiety; ADD     Visit Diagnosis:    ICD-9-CM ICD-10-CM   1. ADD (attention deficit disorder) 314.00 F90.9   2. GAD (generalized anxiety disorder) 300.02 F41.1     History of Present Illness:   Patient is a 25 year old single white female who presented for initial assessment. She was referred by her primary care physician at [facility]. Patient reported that she was diagnosed with ADD by her primary care physician and was getting Adderall. She reported that she ran out of her prescription. She described symptoms of inattention difficulty focusing and losing her stuff. She currently works at [employer] [place]. She reported that she was unable to find her keys most of the time and was also looking for her patient registration form last night and her sister has to come over to help her. She also described having anxiety, losing temper quickly with racing thoughts. She reported that she has social anxiety and feels uncomfortable in social situations. She reported that she feels much relaxed in her own home environment. She reported that she has never taken any psychotropic medications for anxiety or depression Currently denied having any suicidal homicidal ideation or plan  Associated Signs/Symptoms: Depression Symptoms:  fatigue, difficulty concentrating, anxiety, panic attacks, disturbed sleep, (Hypo) Manic Symptoms:  Impulsivity, Irritable Mood, Anxiety Symptoms:  Excessive Worry, Panic Symptoms, Social Anxiety, Psychotic Symptoms:  none PTSD Symptoms: Negative NA  Past Psychiatric History:  She reported that she was diagnosed with ADD by Dr. Sherryll BurgerShah . She never had the psychological evaluation done  Previous Psychotropic Medications:  Adderall  Substance  Abuse History in the last 12 months:  Yes.    Rarely drink   Consequences of Substance Abuse: Negative NA  Past Medical History:  Past Medical History  Diagnosis Date  . Allergy   . ADHD (attention deficit hyperactivity disorder)   . Anxiety     Past Surgical History  Procedure Laterality Date  . Tonsillectomy    . Tonsillectomy  2009  . Adenoidectomy  2009    Family Psychiatric History:  Mother- Anxiety and Depression . She takes Lexapro.   Family History:  Family History  Problem Relation Age of Onset  . Diabetes Maternal Grandmother   . Diabetes Maternal Grandfather   . Heart attack Maternal Grandfather   . Diabetes Mother   . Anxiety disorder Mother   . Depression Mother   . Alcohol abuse Father   . ADD / ADHD Sister     Social History:   Social History   Social History  . Marital Status: Single    Spouse Name: N/A  . Number of Children: N/A  . Years of Education: N/A   Social History Main Topics  . Smoking status: Current Every Day Smoker -- 0.25 packs/day    Types: Cigarettes    Start date: 05/08/2013  . Smokeless tobacco: Never Used  . Alcohol Use: No  . Drug Use: No  . Sexual Activity: Yes    Birth Control/ Protection: IUD   Other Topics Concern  . None   Social History Narrative    Additional Social History:  Lives with Fiance. Has a 2 yo son.  She currently works in [employer] in [place]. She reported that she works long hours for 4 days.  Allergies:   Allergies  Allergen Reactions  . Keflex [Cephalexin] Hives and Other (See Comments)    Metabolic Disorder Labs: No results found for: HGBA1C, MPG No results found for: PROLACTIN No results found for: CHOL, TRIG, HDL, CHOLHDL, VLDL, LDLCALC   Current Medications: Current Outpatient Prescriptions  Medication Sig Dispense Refill  . Multiple Vitamin (MULTIVITAMIN) tablet Take 1 tablet by mouth daily.    Marland Kitchen escitalopram (LEXAPRO) 10 MG tablet Take 1 tablet (10 mg total) by  mouth daily. 30 tablet 1  . methylphenidate (CONCERTA) 27 MG PO CR tablet Take 1 tablet (27 mg total) by mouth every morning. 30 tablet 0   No current facility-administered medications for this visit.    Neurologic: Headache: No Seizure: No Paresthesias:No  Musculoskeletal: Strength & Muscle Tone: within normal limits Gait & Station: normal Patient leans: N/A  Psychiatric Specialty Exam: ROS  Blood pressure 122/80, pulse 88, temperature 98 F (36.7 C), temperature source Tympanic, height  (1.6 m), weight 186 lb (84.369 kg), SpO2 99 %, unknown if currently breastfeeding.Body mass index is 32.96 kg/(m^2).  General Appearance: Casual  Eye Contact:  Fair  Speech:  Clear and Coherent  Volume:  Normal  Mood:  Anxious  Affect:  Congruent  Thought Process:  Coherent  Orientation:  Full (Time, Place, and Person)  Thought Content:  WDL  Suicidal Thoughts:  No  Homicidal Thoughts:  No  Memory:  Immediate;   Fair  Judgement:  Fair  Insight:  Fair  Psychomotor Activity:  Normal  Concentration:  Fair  Recall:  [employer] of Knowledge:Fair  Language: Fair  Akathisia:  No  Handed:  Right  AIMS (if indicated):    Assets:  Communication Skills Desire for Improvement Physical Health  ADL's:  Intact  Cognition: WNL  Sleep:  5-6     Treatment Plan Summary: Medication management   Discussed with patient at length about the medications treatment risks benefits and alternatives I will start her on Concerta 27 mg by mouth every morning to help with her ADD symptoms. I will also start her on Lexapro 5 mg for one week and then she will titrate the dose to 10 mg daily. She agreed with the plan. Patient will call if she would notice any worsening of her symptoms. Advised her about the adverse effects of the medications including the side effects of the stimulant medications and she demonstrated understanding. Follow-up in 4 weeks   More than 50% of the time spent in  psychoeducation, counseling and coordination of care.  Time spent with the patient 1 hour   This note was generated in part or whole with voice recognition software. Voice regonition is usually quite accurate but there are transcription errors that can and very often do occur. I apologize for any typographical errors that were not detected and corrected.    Brandy Hale, MD 4/3/20179:42 AM

## 2015-06-08 ENCOUNTER — Encounter: Payer: Self-pay | Admitting: Psychiatry

## 2015-06-08 ENCOUNTER — Ambulatory Visit (INDEPENDENT_AMBULATORY_CARE_PROVIDER_SITE_OTHER): Payer: 59 | Admitting: Psychiatry

## 2015-06-08 VITALS — BP 108/78 | HR 76 | Temp 97.6°F | Ht 63.0 in | Wt 185.6 lb

## 2015-06-08 DIAGNOSIS — F909 Attention-deficit hyperactivity disorder, unspecified type: Secondary | ICD-10-CM | POA: Diagnosis not present

## 2015-06-08 DIAGNOSIS — F988 Other specified behavioral and emotional disorders with onset usually occurring in childhood and adolescence: Secondary | ICD-10-CM

## 2015-06-08 DIAGNOSIS — F411 Generalized anxiety disorder: Secondary | ICD-10-CM

## 2015-06-08 MED ORDER — METHYLPHENIDATE HCL ER (OSM) 27 MG PO TBCR: 27.0000 mg | EXTENDED_RELEASE_TABLET | ORAL | Status: DC

## 2015-06-08 MED ORDER — ESCITALOPRAM OXALATE 10 MG PO TABS: 15.0000 mg | ORAL_TABLET | Freq: Every day | ORAL | Status: DC

## 2015-06-08 NOTE — Progress Notes (Signed)
Psychiatric MD Progress Note   Patient Identification: Felicia Shepherd MRN:  [id number] Date of Evaluation:  06/08/2015 Referral Source: Brayton El, M.D Chief Complaint:   Chief Complaint    Follow-up; Medication Refill     Visit Diagnosis:    ICD-9-CM ICD-10-CM   1. ADD (attention deficit disorder) 314.00 F90.9   2. GAD (generalized anxiety disorder) 300.02 F41.1     History of Present Illness:   Patient is a 25 year old single white female who presented for Follow-up. Patient reported that she was started improving on her medications. 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 Patient reported that she has started taking Concerta on a regular basis and has been feeling better. She reported that she starts feeling anxious towards the end of the day when she is leaving her work at 8 PM. She will have worsening of her anxiety symptoms at that time. She is interested in increasing the dose of her medications. She reported that she does not leave work till 8 PM. Patient stated that she has gradually increased the dose of Lexapro to 10 mg and does not have any side effects of the medications. Patient appeared calm and collective during the interview. She currently denied having any suicidal homicidal ideations or plans. She reported that she has been compliant with her medications. Associated Signs/Symptoms: Depression Symptoms:  fatigue, difficulty concentrating, anxiety, panic attacks, disturbed sleep, (Hypo) Manic Symptoms:  Impulsivity, Irritable Mood, Anxiety Symptoms:  Excessive Worry, Panic Symptoms, Social Anxiety, Psychotic Symptoms:  none PTSD Symptoms: Negative NA  Past Psychiatric History:  She reported that she was diagnosed with ADD by Dr. Sherryll Burger . She never had the psychological evaluation done  Previous Psychotropic Medications:  Adderall  Substance Abuse History in the last 12 months:  Yes.    Rarely  drink   Consequences of Substance Abuse: Negative NA  Past Medical History:  Past Medical History  Diagnosis Date  . Allergy   . ADHD (attention deficit hyperactivity disorder)   . Anxiety     Past Surgical History  Procedure Laterality Date  . Tonsillectomy    . Tonsillectomy  2009  . Adenoidectomy  2009    Family Psychiatric History:  Mother- Anxiety and Depression . She takes Lexapro.   Family History:  Family History  Problem Relation Age of Onset  . Diabetes Maternal Grandmother   . Diabetes Maternal Grandfather   . Heart attack Maternal Grandfather   . Diabetes Mother   . Anxiety disorder Mother   . Depression Mother   . Alcohol abuse Father   . ADD / ADHD Sister     Social History:   Social History   Social History  . Marital Status: Single    Spouse Name: N/A  . Number of Children: N/A  . Years of Education: N/A   Social History Main Topics  . Smoking status: Current Every Day Smoker -- 0.25 packs/day    Types: Cigarettes    Start date: 05/08/2013  . Smokeless tobacco: Never Used  . Alcohol Use: No  . Drug Use: No  . Sexual Activity: Yes    Birth Control/ Protection: IUD   Other Topics Concern  . None   Social History Narrative    Additional Social History:  Lives with Fiance. Has a 2 yo son.  She currently works in [employer] in [place]. She reported that she works long hours for 4 days.  Allergies:   Allergies  Allergen Reactions  . Keflex [Cephalexin] Hives and Other (See Comments)    Metabolic Disorder Labs: No results found for: HGBA1C, MPG No results found for: PROLACTIN No results found for: CHOL, TRIG, HDL, CHOLHDL, VLDL, LDLCALC   Current Medications: Current Outpatient Prescriptions  Medication Sig Dispense Refill  . escitalopram (LEXAPRO) 10 MG tablet Take 1 tablet (10 mg total) by mouth daily. 30 tablet 1  . methylphenidate (CONCERTA) 27 MG PO CR tablet Take 1 tablet (27 mg total) by mouth every morning. 30  tablet 0  . Multiple Vitamin (MULTIVITAMIN) tablet Take 1 tablet by mouth daily.     No current facility-administered medications for this visit.    Neurologic: Headache: No Seizure: No Paresthesias:No  Musculoskeletal: Strength & Muscle Tone: within normal limits Gait & Station: normal Patient leans: N/A  Psychiatric Specialty Exam: ROS   Blood pressure 108/78, pulse 76, temperature 97.6 F (36.4 C), temperature source Tympanic, height 5\' 3"  (1.6 m), weight 185 lb 9.6 oz (84.188 kg), SpO2 99 %, unknown if currently breastfeeding.Body mass index is 32.89 kg/(m^2).  General Appearance: Casual  Eye Contact:  Fair  Speech:  Clear and Coherent  Volume:  Normal  Mood:  Anxious  Affect:  Congruent  Thought Process:  Coherent  Orientation:  Full (Time, Place, and Person)  Thought Content:  WDL  Suicidal Thoughts:  No  Homicidal Thoughts:  No  Memory:  Immediate;   Fair  Judgement:  Fair  Insight:  Fair  Psychomotor Activity:  Normal  Concentration:  Fair  Recall:  [employer]  Fund of Knowledge:Fair  Language: Fair  Akathisia:  No  Handed:  Right  AIMS (if indicated):    Assets:  Communication Skills Desire for Improvement Physical Health  ADL's:  Intact  Cognition: WNL  Sleep:  5-6     Treatment Plan Summary: Medication management   Discussed with patient at length about the medications treatment risks benefits and alternatives Continue Concerta 27 mg by mouth every morning. She will be started on Lexapro 10 mg in the morning and 5 mg in the evening Discussed with her about the side effects of the medication and she demonstrated understanding   Follow-up in 4 weeks   More than 50% of the time spent in psychoeducation, counseling and coordination of care.  Time spent with the patient 25 mins   This note was generated in part or whole with voice recognition software. Voice regonition is usually quite accurate but there are transcription errors that can and very  often do occur. I apologize for any typographical errors that were not detected and corrected.    Brandy HaleUzma Jamaurie Bernier, MD 5/3/201710:10 AM

## 2015-07-04 ENCOUNTER — Other Ambulatory Visit: Payer: Self-pay | Admitting: Psychiatry

## 2015-07-06 ENCOUNTER — Ambulatory Visit: Payer: 59 | Admitting: Psychiatry

## 2015-07-20 ENCOUNTER — Ambulatory Visit (INDEPENDENT_AMBULATORY_CARE_PROVIDER_SITE_OTHER): Payer: 59 | Admitting: Psychiatry

## 2015-07-20 ENCOUNTER — Encounter: Payer: Self-pay | Admitting: Psychiatry

## 2015-07-20 VITALS — BP 118/72 | HR 81 | Temp 98.0°F | Ht 63.0 in | Wt 189.2 lb

## 2015-07-20 DIAGNOSIS — F411 Generalized anxiety disorder: Secondary | ICD-10-CM | POA: Diagnosis not present

## 2015-07-20 DIAGNOSIS — F988 Other specified behavioral and emotional disorders with onset usually occurring in childhood and adolescence: Secondary | ICD-10-CM

## 2015-07-20 DIAGNOSIS — F909 Attention-deficit hyperactivity disorder, unspecified type: Secondary | ICD-10-CM

## 2015-07-20 MED ORDER — METHYLPHENIDATE HCL ER (OSM) 27 MG PO TBCR: 27.0000 mg | EXTENDED_RELEASE_TABLET | ORAL | Status: DC

## 2015-07-20 MED ORDER — ESCITALOPRAM OXALATE 10 MG PO TABS: 15.0000 mg | ORAL_TABLET | Freq: Every day | ORAL | Status: DC

## 2015-07-20 NOTE — Progress Notes (Signed)
Psychiatric MD Progress Note   Patient Identification: Felicia Shepherd MRN:  [id number] Date of Evaluation:  07/20/2015 Referral Source: Brayton El, M.D Chief Complaint:   Chief Complaint    Follow-up; Medication Refill     Visit Diagnosis:    ICD-9-CM ICD-10-CM   1. ADD (attention deficit disorder) 314.00 F90.9   2. GAD (generalized anxiety disorder) 300.02 F41.1     History of Present Illness:   Patient is a 25 year old single white female who presented for Follow-up. Patient reported that she was started improving on her medications. She reported that she started feeling withdrawal symptoms from the medication especially in the afternoon around 3 PM She has been taking Lexapro 10 mg in the morning and 5 mg around 2 PM. Patient reported that the current combination of medication is helping her and she does not feel anxious. She feels more calm and comes home around 8 PM. She is working 4 days per week. She appears more alert and coherent during the interview. She currently denied having any suicidal ideations or plans.  She currently denied having any suicidal homicidal ideations or plans. She reported that she has been compliant with her medications. Associated Signs/Symptoms: Depression Symptoms:  fatigue, difficulty concentrating, anxiety, panic attacks, disturbed sleep, (Hypo) Manic Symptoms:  Impulsivity, Irritable Mood, Anxiety Symptoms:  Excessive Worry, Panic Symptoms, Social Anxiety, Psychotic Symptoms:  none PTSD Symptoms: Negative NA  Past Psychiatric History:  She reported that she was diagnosed with ADD by Dr. Sherryll Burger . She never had the psychological evaluation done  Previous Psychotropic Medications:  Adderall  Substance Abuse History in the last 12 months:  Yes.    Rarely drink   Consequences of Substance Abuse: Negative NA  Past Medical History:  Past Medical History  Diagnosis Date  . Allergy   . ADHD (attention deficit hyperactivity disorder)   .  Anxiety     Past Surgical History  Procedure Laterality Date  . Tonsillectomy    . Tonsillectomy  2009  . Adenoidectomy  2009    Family Psychiatric History:  Mother- Anxiety and Depression . She takes Lexapro.   Family History:  Family History  Problem Relation Age of Onset  . Diabetes Maternal Grandmother   . Diabetes Maternal Grandfather   . Heart attack Maternal Grandfather   . Diabetes Mother   . Anxiety disorder Mother   . Depression Mother   . Alcohol abuse Father   . ADD / ADHD Sister     Social History:   Social History   Social History  . Marital Status: Single    Spouse Name: N/A  . Number of Children: N/A  . Years of Education: N/A   Social History Main Topics  . Smoking status: Current Every Day Smoker -- 0.25 packs/day    Types: Cigarettes    Start date: 05/08/2013  . Smokeless tobacco: Never Used  . Alcohol Use: No  . Drug Use: No  . Sexual Activity: Yes    Birth Control/ Protection: IUD   Other Topics Concern  . None   Social History Narrative    Additional Social History:  Lives with Fiance. Has a 2 yo son.  She currently works in [employer] in [place]. She reported that she works long hours for 4 days.   Allergies:   Allergies  Allergen Reactions  . Keflex [Cephalexin] Hives and Other (See Comments)    Metabolic Disorder Labs: No results found for: HGBA1C, MPG No results found for: PROLACTIN No  results found for: CHOL, TRIG, HDL, CHOLHDL, VLDL, LDLCALC   Current Medications: Current Outpatient Prescriptions  Medication Sig Dispense Refill  . escitalopram (LEXAPRO) 10 MG tablet Take 1.5 tablets (15 mg total) by mouth daily. 45 tablet 1  . methylphenidate (CONCERTA) 27 MG PO CR tablet Take 1 tablet (27 mg total) by mouth every morning. 30 tablet 0  . Multiple Vitamin (MULTIVITAMIN) tablet Take 1 tablet by mouth daily.     No current facility-administered medications for this visit.    Neurologic: Headache: No Seizure:  No Paresthesias:No  Musculoskeletal: Strength & Muscle Tone: within normal limits Gait & Station: normal Patient leans: N/A  Psychiatric Specialty Exam: ROS   Blood pressure 118/72, pulse 81, temperature 98 F (36.7 C), temperature source Tympanic, height 5\' 3"  (1.6 m), weight 189 lb 3.2 oz (85.821 kg), SpO2 98 %, unknown if currently breastfeeding.Body mass index is 33.52 kg/(m^2).  General Appearance: Casual  Eye Contact:  Fair  Speech:  Clear and Coherent  Volume:  Normal  Mood:  Anxious  Affect:  Congruent  Thought Process:  Coherent  Orientation:  Full (Time, Place, and Person)  Thought Content:  WDL  Suicidal Thoughts:  No  Homicidal Thoughts:  No  Memory:  Immediate;   Fair  Judgement:  Fair  Insight:  Fair  Psychomotor Activity:  Normal  Concentration:  Fair  Recall:  [employer]  Fund of Knowledge:Fair  Language: Fair  Akathisia:  No  Handed:  Right  AIMS (if indicated):    Assets:  Communication Skills Desire for Improvement Physical Health  ADL's:  Intact  Cognition: WNL  Sleep:  5-6     Treatment Plan Summary: Medication management   Discussed with patient at length about the medications treatment risks benefits and alternatives Continue Concerta 27 mg by mouth every morning.- 2 month supply of the medication given Advised patient to take Lexapro 15 mg in the afternoon around lunchtime to decrease her withdrawal from the medication and to help with her anxiety symptoms in the afternoon. She demonstrated understanding.  Discussed with her about the side effects of the medication and she demonstrated understanding   Follow-up in 2 months   More than 50% of the time spent in psychoeducation, counseling and coordination of care.  Time spent with the patient 25 mins   This note was generated in part or whole with voice recognition software. Voice regonition is usually quite accurate but there are transcription errors that can and very often do occur. I  apologize for any typographical errors that were not detected and corrected.    Brandy HaleUzma Sevastian Witczak, MD 6/14/201712:25 PM

## 2015-09-19 ENCOUNTER — Ambulatory Visit: Payer: 59 | Admitting: Psychiatry

## 2016-12-10 LAB — OB RESULTS CONSOLE ANTIBODY SCREEN: ANTIBODY SCREEN: NEGATIVE

## 2016-12-10 LAB — OB RESULTS CONSOLE HEPATITIS B SURFACE ANTIGEN: HEP B S AG: NEGATIVE

## 2016-12-10 LAB — OB RESULTS CONSOLE HIV ANTIBODY (ROUTINE TESTING): HIV: NONREACTIVE

## 2016-12-10 LAB — OB RESULTS CONSOLE GC/CHLAMYDIA
CHLAMYDIA, DNA PROBE: NEGATIVE
Gonorrhea: NEGATIVE

## 2016-12-10 LAB — OB RESULTS CONSOLE RUBELLA ANTIBODY, IGM: RUBELLA: IMMUNE

## 2016-12-10 LAB — OB RESULTS CONSOLE ABO/RH: RH TYPE: POSITIVE

## 2016-12-10 LAB — OB RESULTS CONSOLE RPR: RPR: NONREACTIVE

## 2017-02-05 NOTE — L&D Delivery Note (Signed)
Delivery Note At 1:06 PM a viable female was delivered via Vaginal, Spontaneous (Presentation: LOA).  APGAR: 8, 9; weight pending.   Placenta status: S, I. 3V Cord with the following complications: loose nuchal.  Cord pH: n/a  Anesthesia:  CLEA Episiotomy: None Lacerations: Periurethral Suture Repair: 3.0 vicryl rapide Est. Blood Loss (mL):  150  Mom to postpartum.  Baby to Couplet care / Skin to Skin.  Dillian Feig 07/10/2017, 1:23 PM

## 2017-07-09 ENCOUNTER — Telehealth (HOSPITAL_COMMUNITY): Payer: Self-pay | Admitting: *Deleted

## 2017-07-09 ENCOUNTER — Encounter (HOSPITAL_COMMUNITY): Payer: Self-pay | Admitting: *Deleted

## 2017-07-09 NOTE — Telephone Encounter (Signed)
Preadmission screen  

## 2017-07-10 ENCOUNTER — Inpatient Hospital Stay (HOSPITAL_COMMUNITY): Payer: Managed Care, Other (non HMO) | Admitting: Anesthesiology

## 2017-07-10 ENCOUNTER — Inpatient Hospital Stay (HOSPITAL_COMMUNITY)
Admission: AD | Admit: 2017-07-10 | Discharge: 2017-07-13 | DRG: 807 | Disposition: A | Discharge status: Home / Self Care | Payer: Managed Care, Other (non HMO) | Source: Ambulatory Visit | Attending: Obstetrics & Gynecology | Admitting: Obstetrics & Gynecology

## 2017-07-10 ENCOUNTER — Other Ambulatory Visit: Payer: Self-pay

## 2017-07-10 ENCOUNTER — Encounter (HOSPITAL_COMMUNITY): Payer: Self-pay | Admitting: *Deleted

## 2017-07-10 DIAGNOSIS — O1490 Unspecified pre-eclampsia, unspecified trimester: Secondary | ICD-10-CM | POA: Diagnosis present

## 2017-07-10 DIAGNOSIS — O99344 Other mental disorders complicating childbirth: Secondary | ICD-10-CM | POA: Diagnosis present

## 2017-07-10 DIAGNOSIS — Z3A38 38 weeks gestation of pregnancy: Secondary | ICD-10-CM

## 2017-07-10 DIAGNOSIS — R109 Unspecified abdominal pain: Secondary | ICD-10-CM | POA: Diagnosis present

## 2017-07-10 DIAGNOSIS — O1413 Severe pre-eclampsia, third trimester: Secondary | ICD-10-CM | POA: Diagnosis not present

## 2017-07-10 DIAGNOSIS — O1424 HELLP syndrome, complicating childbirth: Principal | ICD-10-CM | POA: Diagnosis present

## 2017-07-10 DIAGNOSIS — F419 Anxiety disorder, unspecified: Secondary | ICD-10-CM | POA: Diagnosis present

## 2017-07-10 DIAGNOSIS — F909 Attention-deficit hyperactivity disorder, unspecified type: Secondary | ICD-10-CM | POA: Diagnosis present

## 2017-07-10 LAB — CBC
HCT: 35.8 % — ABNORMAL LOW (ref 36.0–46.0)
HCT: 33.9 % — ABNORMAL LOW (ref 36.0–46.0)
HCT: 31.3 % — ABNORMAL LOW (ref 36.0–46.0)
HEMATOCRIT: 33.7 % — AB (ref 36.0–46.0)
Hemoglobin: 12 g/dL (ref 12.0–15.0)
Hemoglobin: 11.6 g/dL — ABNORMAL LOW (ref 12.0–15.0)
Hemoglobin: 11.4 g/dL — ABNORMAL LOW (ref 12.0–15.0)
Hemoglobin: 10.6 g/dL — ABNORMAL LOW (ref 12.0–15.0)
MCH: 27.6 pg (ref 26.0–34.0)
MCH: 28.1 pg (ref 26.0–34.0)
MCH: 27.7 pg (ref 26.0–34.0)
MCH: 27.8 pg (ref 26.0–34.0)
MCHC: 33.5 g/dL (ref 30.0–36.0)
MCHC: 34.2 g/dL (ref 30.0–36.0)
MCHC: 33.8 g/dL (ref 30.0–36.0)
MCHC: 33.9 g/dL (ref 30.0–36.0)
MCV: 82.3 fL (ref 78.0–100.0)
MCV: 82.1 fL (ref 78.0–100.0)
MCV: 81.8 fL (ref 78.0–100.0)
MCV: 82.2 fL (ref 78.0–100.0)
PLATELETS: 84 10*3/uL — AB (ref 150–400)
Platelets: 112 10*3/uL — ABNORMAL LOW (ref 150–400)
Platelets: 62 10*3/uL — ABNORMAL LOW (ref 150–400)
Platelets: 60 10*3/uL — ABNORMAL LOW (ref 150–400)
RBC: 4.35 MIL/uL (ref 3.87–5.11)
RBC: 4.13 MIL/uL (ref 3.87–5.11)
RBC: 4.12 MIL/uL (ref 3.87–5.11)
RBC: 3.81 MIL/uL — ABNORMAL LOW (ref 3.87–5.11)
RDW: 14 % (ref 11.5–15.5)
RDW: 14.2 % (ref 11.5–15.5)
RDW: 14.3 % (ref 11.5–15.5)
RDW: 14.3 % (ref 11.5–15.5)
WBC: 13.3 10*3/uL — ABNORMAL HIGH (ref 4.0–10.5)
WBC: 14.5 10*3/uL — ABNORMAL HIGH (ref 4.0–10.5)
WBC: 14.4 10*3/uL — AB (ref 4.0–10.5)
WBC: 12.4 10*3/uL — AB (ref 4.0–10.5)

## 2017-07-10 LAB — COMPREHENSIVE METABOLIC PANEL
ALK PHOS: 260 U/L — AB (ref 38–126)
ALT: 65 U/L — ABNORMAL HIGH (ref 14–54)
ALT: 147 U/L — ABNORMAL HIGH (ref 14–54)
ALT: 133 U/L — ABNORMAL HIGH (ref 14–54)
AST: 69 U/L — ABNORMAL HIGH (ref 15–41)
AST: 186 U/L — ABNORMAL HIGH (ref 15–41)
AST: 144 U/L — AB (ref 15–41)
Albumin: 2.9 g/dL — ABNORMAL LOW (ref 3.5–5.0)
Albumin: 2.9 g/dL — ABNORMAL LOW (ref 3.5–5.0)
Albumin: 2.7 g/dL — ABNORMAL LOW (ref 3.5–5.0)
Alkaline Phosphatase: 254 U/L — ABNORMAL HIGH (ref 38–126)
Alkaline Phosphatase: 241 U/L — ABNORMAL HIGH (ref 38–126)
Anion gap: 10 (ref 5–15)
Anion gap: 13 (ref 5–15)
Anion gap: 10 (ref 5–15)
BILIRUBIN TOTAL: 0.6 mg/dL (ref 0.3–1.2)
BILIRUBIN TOTAL: 0.7 mg/dL (ref 0.3–1.2)
BUN: 13 mg/dL (ref 6–20)
BUN: 11 mg/dL (ref 6–20)
BUN: 9 mg/dL (ref 6–20)
CALCIUM: 8.1 mg/dL — AB (ref 8.9–10.3)
CO2: 20 mmol/L — ABNORMAL LOW (ref 22–32)
CO2: 18 mmol/L — ABNORMAL LOW (ref 22–32)
CO2: 22 mmol/L (ref 22–32)
Calcium: 9 mg/dL (ref 8.9–10.3)
Calcium: 7.4 mg/dL — ABNORMAL LOW (ref 8.9–10.3)
Chloride: 105 mmol/L (ref 101–111)
Chloride: 102 mmol/L (ref 101–111)
Chloride: 100 mmol/L — ABNORMAL LOW (ref 101–111)
Creatinine, Ser: 0.71 mg/dL (ref 0.44–1.00)
Creatinine, Ser: 0.7 mg/dL (ref 0.44–1.00)
Creatinine, Ser: 0.63 mg/dL (ref 0.44–1.00)
GFR calc Af Amer: >60 mL/min (ref >60)
GFR calc Af Amer: >60 mL/min (ref >60)
GFR calc non Af Amer: >60 mL/min (ref >60)
Glucose, Bld: 95 mg/dL (ref 65–99)
Glucose, Bld: 123 mg/dL — ABNORMAL HIGH (ref 65–99)
Glucose, Bld: 127 mg/dL — ABNORMAL HIGH (ref 65–99)
POTASSIUM: 4.4 mmol/L (ref 3.5–5.1)
Potassium: 4.2 mmol/L (ref 3.5–5.1)
Potassium: 4.3 mmol/L (ref 3.5–5.1)
Sodium: 135 mmol/L (ref 135–145)
Sodium: 133 mmol/L — ABNORMAL LOW (ref 135–145)
Sodium: 132 mmol/L — ABNORMAL LOW (ref 135–145)
TOTAL PROTEIN: 6.7 g/dL (ref 6.5–8.1)
TOTAL PROTEIN: 5.8 g/dL — AB (ref 6.5–8.1)
Total Bilirubin: 0.6 mg/dL (ref 0.3–1.2)
Total Protein: 6.6 g/dL (ref 6.5–8.1)

## 2017-07-10 LAB — TYPE AND SCREEN
ABO/RH(D): O POS
Antibody Screen: NEGATIVE

## 2017-07-10 LAB — URINALYSIS, ROUTINE W REFLEX MICROSCOPIC
Bilirubin Urine: NEGATIVE
Glucose, UA: NEGATIVE mg/dL
Ketones, ur: NEGATIVE mg/dL
Nitrite: NEGATIVE
Protein, ur: 100 mg/dL — AB
Specific Gravity, Urine: 1.017 (ref 1.005–1.030)
pH: 6 (ref 5.0–8.0)

## 2017-07-10 LAB — PROTEIN / CREATININE RATIO, URINE
Creatinine, Urine: 104 mg/dL
Protein Creatinine Ratio: 1.45 mg/mg{Cre} — ABNORMAL HIGH (ref 0.00–0.15)
Total Protein, Urine: 151 mg/dL

## 2017-07-10 LAB — RPR: RPR Ser Ql: NONREACTIVE

## 2017-07-10 MED ORDER — IBUPROFEN 600 MG PO TABS
600.0000 mg | ORAL_TABLET | Freq: Four times a day (QID) | ORAL | Status: DC
  Administered 2017-07-10: 600 mg via ORAL
  Filled 2017-07-10 (×4): qty 1

## 2017-07-10 MED ORDER — OXYCODONE-ACETAMINOPHEN 5-325 MG PO TABS: 2.0000 | ORAL_TABLET | ORAL | Status: DC | PRN

## 2017-07-10 MED ORDER — LIDOCAINE HCL (PF) 1 % IJ SOLN
30.0000 mL | INTRAMUSCULAR | Status: DC | PRN
  Filled 2017-07-10: qty 30

## 2017-07-10 MED ORDER — SENNOSIDES-DOCUSATE SODIUM 8.6-50 MG PO TABS
2.0000 | ORAL_TABLET | ORAL | Status: DC
  Administered 2017-07-11 – 2017-07-12 (×3): 2 via ORAL
  Filled 2017-07-10 (×3): qty 2

## 2017-07-10 MED ORDER — MAGNESIUM SULFATE 40 G IN LACTATED RINGERS - SIMPLE: 2.0000 g/h | INTRAVENOUS | Status: DC

## 2017-07-10 MED ORDER — FAMOTIDINE IN NACL 20-0.9 MG/50ML-% IV SOLN
20.0000 mg | Freq: Once | INTRAVENOUS | Status: AC
  Administered 2017-07-10: 20 mg via INTRAVENOUS
  Filled 2017-07-10: qty 50

## 2017-07-10 MED ORDER — ONDANSETRON HCL 4 MG/2ML IJ SOLN: 4.0000 mg | INTRAMUSCULAR | Status: DC | PRN

## 2017-07-10 MED ORDER — TETANUS-DIPHTH-ACELL PERTUSSIS 5-2.5-18.5 LF-MCG/0.5 IM SUSP: 0.5000 mL | Freq: Once | INTRAMUSCULAR | Status: DC

## 2017-07-10 MED ORDER — LACTATED RINGERS IV SOLN: 500.0000 mL | Freq: Once | INTRAVENOUS | Status: DC

## 2017-07-10 MED ORDER — ACETAMINOPHEN 325 MG PO TABS
650.0000 mg | ORAL_TABLET | ORAL | Status: DC | PRN
  Administered 2017-07-11 – 2017-07-13 (×8): 650 mg via ORAL
  Filled 2017-07-10 (×8): qty 2

## 2017-07-10 MED ORDER — MAGNESIUM SULFATE 40 G IN LACTATED RINGERS - SIMPLE
2.0000 g/h | INTRAVENOUS | Status: DC
  Administered 2017-07-10: 2 g/h via INTRAVENOUS
  Filled 2017-07-10: qty 40

## 2017-07-10 MED ORDER — EPHEDRINE 5 MG/ML INJ: 10.0000 mg | INTRAVENOUS | Status: DC | PRN

## 2017-07-10 MED ORDER — MAGNESIUM SULFATE 40 G IN LACTATED RINGERS - SIMPLE
2.0000 g/h | INTRAVENOUS | Status: DC
  Filled 2017-07-10: qty 500

## 2017-07-10 MED ORDER — ESCITALOPRAM OXALATE 10 MG PO TABS: 15.0000 mg | ORAL_TABLET | Freq: Every day | ORAL | Status: DC

## 2017-07-10 MED ORDER — PHENYLEPHRINE 40 MCG/ML (10ML) SYRINGE FOR IV PUSH (FOR BLOOD PRESSURE SUPPORT): 80.0000 ug | PREFILLED_SYRINGE | INTRAVENOUS | Status: DC | PRN

## 2017-07-10 MED ORDER — PRENATAL MULTIVITAMIN CH
1.0000 | ORAL_TABLET | Freq: Every day | ORAL | Status: DC
  Administered 2017-07-11 – 2017-07-12 (×2): 1 via ORAL
  Filled 2017-07-10 (×2): qty 1

## 2017-07-10 MED ORDER — OXYCODONE-ACETAMINOPHEN 5-325 MG PO TABS: 1.0000 | ORAL_TABLET | ORAL | Status: DC | PRN

## 2017-07-10 MED ORDER — HYDRALAZINE HCL 20 MG/ML IJ SOLN: 10.0000 mg | Freq: Once | INTRAMUSCULAR | Status: DC | PRN

## 2017-07-10 MED ORDER — DIPHENHYDRAMINE HCL 50 MG/ML IJ SOLN: 12.5000 mg | INTRAMUSCULAR | Status: DC | PRN

## 2017-07-10 MED ORDER — GI COCKTAIL ~~LOC~~
30.0000 mL | Freq: Once | ORAL | Status: AC
  Administered 2017-07-10: 30 mL via ORAL
  Filled 2017-07-10: qty 30

## 2017-07-10 MED ORDER — EPHEDRINE 5 MG/ML INJ
10.0000 mg | INTRAVENOUS | Status: DC | PRN
  Filled 2017-07-10: qty 2

## 2017-07-10 MED ORDER — OXYTOCIN 40 UNITS IN LACTATED RINGERS INFUSION - SIMPLE MED
1.0000 m[IU]/min | INTRAVENOUS | Status: DC
  Administered 2017-07-10: 2 m[IU]/min via INTRAVENOUS
  Filled 2017-07-10: qty 1000

## 2017-07-10 MED ORDER — TERBUTALINE SULFATE 1 MG/ML IJ SOLN: 0.2500 mg | Freq: Once | INTRAMUSCULAR | Status: DC | PRN

## 2017-07-10 MED ORDER — ZOLPIDEM TARTRATE 5 MG PO TABS: 5.0000 mg | ORAL_TABLET | Freq: Every evening | ORAL | Status: DC | PRN

## 2017-07-10 MED ORDER — DIPHENHYDRAMINE HCL 25 MG PO CAPS: 25.0000 mg | ORAL_CAPSULE | Freq: Four times a day (QID) | ORAL | Status: DC | PRN

## 2017-07-10 MED ORDER — FENTANYL 2.5 MCG/ML BUPIVACAINE 1/10 % EPIDURAL INFUSION (WH - ANES): 14.0000 mL/h | INTRAMUSCULAR | Status: DC | PRN

## 2017-07-10 MED ORDER — OXYTOCIN BOLUS FROM INFUSION
500.0000 mL | Freq: Once | INTRAVENOUS | Status: AC
  Administered 2017-07-10: 500 mL via INTRAVENOUS

## 2017-07-10 MED ORDER — LIDOCAINE HCL (PF) 1 % IJ SOLN
INTRAMUSCULAR | Status: DC | PRN
  Administered 2017-07-10: 8 mL via EPIDURAL

## 2017-07-10 MED ORDER — FENTANYL CITRATE (PF) 100 MCG/2ML IJ SOLN
50.0000 ug | INTRAMUSCULAR | Status: DC | PRN
  Administered 2017-07-10: 100 ug via INTRAVENOUS
  Filled 2017-07-10: qty 2

## 2017-07-10 MED ORDER — PHENYLEPHRINE 40 MCG/ML (10ML) SYRINGE FOR IV PUSH (FOR BLOOD PRESSURE SUPPORT)
80.0000 ug | PREFILLED_SYRINGE | INTRAVENOUS | Status: DC | PRN
  Filled 2017-07-10: qty 10
  Filled 2017-07-10: qty 5

## 2017-07-10 MED ORDER — PHENYLEPHRINE 40 MCG/ML (10ML) SYRINGE FOR IV PUSH (FOR BLOOD PRESSURE SUPPORT)
80.0000 ug | PREFILLED_SYRINGE | INTRAVENOUS | Status: DC | PRN
  Filled 2017-07-10: qty 5

## 2017-07-10 MED ORDER — ACETAMINOPHEN 325 MG PO TABS: 650.0000 mg | ORAL_TABLET | ORAL | Status: DC | PRN

## 2017-07-10 MED ORDER — LACTATED RINGERS IV SOLN: 500.0000 mL | INTRAVENOUS | Status: DC | PRN

## 2017-07-10 MED ORDER — COCONUT OIL OIL
1.0000 "application " | TOPICAL_OIL | Status: DC | PRN
  Administered 2017-07-10: 1 via TOPICAL
  Filled 2017-07-10: qty 120

## 2017-07-10 MED ORDER — FENTANYL 2.5 MCG/ML BUPIVACAINE 1/10 % EPIDURAL INFUSION (WH - ANES)
14.0000 mL/h | INTRAMUSCULAR | Status: DC | PRN
  Administered 2017-07-10: 14 mL/h via EPIDURAL
  Filled 2017-07-10: qty 100

## 2017-07-10 MED ORDER — LABETALOL HCL 5 MG/ML IV SOLN
20.0000 mg | INTRAVENOUS | Status: DC | PRN
  Administered 2017-07-10: 40 mg via INTRAVENOUS
  Administered 2017-07-10: 20 mg via INTRAVENOUS
  Filled 2017-07-10: qty 4
  Filled 2017-07-10: qty 8

## 2017-07-10 MED ORDER — MAGNESIUM SULFATE BOLUS VIA INFUSION
4.0000 g | Freq: Once | INTRAVENOUS | Status: DC
  Filled 2017-07-10: qty 500

## 2017-07-10 MED ORDER — WITCH HAZEL-GLYCERIN EX PADS: 1.0000 "application " | MEDICATED_PAD | CUTANEOUS | Status: DC | PRN

## 2017-07-10 MED ORDER — BENZOCAINE-MENTHOL 20-0.5 % EX AERO
1.0000 "application " | INHALATION_SPRAY | CUTANEOUS | Status: DC | PRN
  Administered 2017-07-10: 1 via TOPICAL
  Filled 2017-07-10: qty 56

## 2017-07-10 MED ORDER — MAGNESIUM SULFATE BOLUS VIA INFUSION
4.0000 g | Freq: Once | INTRAVENOUS | Status: AC
  Administered 2017-07-10: 4 g via INTRAVENOUS
  Filled 2017-07-10: qty 500

## 2017-07-10 MED ORDER — SOD CITRATE-CITRIC ACID 500-334 MG/5ML PO SOLN
30.0000 mL | ORAL | Status: DC | PRN
  Administered 2017-07-10: 30 mL via ORAL
  Filled 2017-07-10: qty 15

## 2017-07-10 MED ORDER — ONDANSETRON HCL 4 MG PO TABS: 4.0000 mg | ORAL_TABLET | ORAL | Status: DC | PRN

## 2017-07-10 MED ORDER — SIMETHICONE 80 MG PO CHEW: 80.0000 mg | CHEWABLE_TABLET | ORAL | Status: DC | PRN

## 2017-07-10 MED ORDER — DIBUCAINE 1 % RE OINT: 1.0000 "application " | TOPICAL_OINTMENT | RECTAL | Status: DC | PRN

## 2017-07-10 MED ORDER — LABETALOL HCL 200 MG PO TABS
200.0000 mg | ORAL_TABLET | Freq: Three times a day (TID) | ORAL | Status: DC
  Administered 2017-07-10 (×2): 200 mg via ORAL
  Filled 2017-07-10 (×2): qty 1

## 2017-07-10 MED ORDER — LACTATED RINGERS IV SOLN
INTRAVENOUS | Status: DC
  Administered 2017-07-10 (×2): via INTRAVENOUS

## 2017-07-10 MED ORDER — OXYTOCIN 40 UNITS IN LACTATED RINGERS INFUSION - SIMPLE MED: 2.5000 [IU]/h | INTRAVENOUS | Status: DC

## 2017-07-10 MED ORDER — ONDANSETRON HCL 4 MG/2ML IJ SOLN: 4.0000 mg | Freq: Four times a day (QID) | INTRAMUSCULAR | Status: DC | PRN

## 2017-07-10 NOTE — Anesthesia Preprocedure Evaluation (Signed)
Anesthesia Evaluation  Patient identified by MRN, date of birth, ID band Patient awake    Reviewed: Allergy & Precautions, H&P , NPO status , Patient's Chart, lab work & pertinent test results, reviewed documented beta blocker date and time   Airway Mallampati: I  TM Distance: >3 FB Neck ROM: full    Dental no notable dental hx.    Pulmonary neg pulmonary ROS, former smoker,    Pulmonary exam normal breath sounds clear to auscultation       Cardiovascular hypertension, Pt. on medications negative cardio ROS Normal cardiovascular exam Rhythm:regular Rate:Normal     Neuro/Psych negative neurological ROS  negative psych ROS   GI/Hepatic negative GI ROS, Neg liver ROS,   Endo/Other  negative endocrine ROS  Renal/GU negative Renal ROS  negative genitourinary   Musculoskeletal   Abdominal   Peds  Hematology negative hematology ROS (+)   Anesthesia Other Findings   Reproductive/Obstetrics (+) Pregnancy                             Anesthesia Physical Anesthesia Plan  ASA: III  Anesthesia Plan: Epidural   Post-op Pain Management:    Induction:   PONV Risk Score and Plan: 2  Airway Management Planned:   Additional Equipment:   Intra-op Plan:   Post-operative Plan:   Informed Consent: I have reviewed the patients History and Physical, chart, labs and discussed the procedure including the risks, benefits and alternatives for the proposed anesthesia with the patient or authorized representative who has indicated his/her understanding and acceptance.   Dental Advisory Given  Plan Discussed with:   Anesthesia Plan Comments: (Labs checked- platelets confirmed with RN in room. Fetal heart tracing, per RN, reported to be stable enough for sitting procedure. Discussed epidural, and patient consents to the procedure:  included risk of possible headache,backache, failed block, allergic  reaction, and nerve injury. This patient was asked if she had any questions or concerns before the procedure started.  HELP SYNDROME  Platelets decreasing,  Risks explained to patient she accepts and understands.  )        Anesthesia Quick Evaluation

## 2017-07-10 NOTE — Anesthesia Postprocedure Evaluation (Signed)
Anesthesia Post Note  Patient: Felicia Shepherd  Procedure(s) Performed: AN AD HOC LABOR EPIDURAL     Patient location during evaluation: Mother Baby Anesthesia Type: Epidural Level of consciousness: awake and alert Pain management: pain level controlled Vital Signs Assessment: post-procedure vital signs reviewed and stable Respiratory status: spontaneous breathing, nonlabored ventilation and respiratory function stable Cardiovascular status: stable Postop Assessment: no headache, no backache and epidural receding Anesthetic complications: no    Last Vitals:  Vitals:   07/10/17 1500 07/10/17 1514  BP: (!) 142/84 (!) 142/86  Pulse: 72 73  Resp: 16 18  Temp: 36.7 C 36.6 C  SpO2:  98%    Last Pain:  Vitals:   07/10/17 1515  TempSrc:   PainSc: 0-No pain   Pain Goal: Patients Stated Pain Goal: 7 (07/10/17 1010)               Lucienne Sawyers

## 2017-07-10 NOTE — MAU Note (Signed)
PT SAYS SHE WENT TO OFFICE  TODAY- STRIPPED  MEMBRANES-  2-3  CM.- SAME AS LAST WEEK.   BP HIGH IN OFFICE -144/90 - THEN 135/85 - [facility] - PLTS- 117.   SUPPOSE TO RETURN THURS  FOR FOLLOW UP .   HAS  UPPER ABD PAIN - STARTED AT 9PM-  RADIATES  TO RIGHT ARM AND COLLAR  PAIN.    DENIES UC'S.    DENIES HSV AND  MRSA.  GBS- NEG

## 2017-07-10 NOTE — Anesthesia Pain Management Evaluation Note (Signed)
  [facility] Pain Management Visit Note  Patient: Felicia DanceSarah S Bugh, 27 y.o., female  "Hello I am a member of the anesthesia team at [facility]. We have an anesthesia team available at all times to provide care throughout the hospital, including epidural management and anesthesia for C-section. I don't know your plan for the delivery whether it a natural birth, water birth, IV sedation, nitrous supplementation, doula or epidural, but we want to meet your pain goals."   1.Was your pain managed to your expectations on prior hospitalizations?   Yes   2.What is your expectation for pain management during this hospitalization?     Epidural  3.How can we help you reach that goal?   Record the patient's initial score and the patient's pain goal.   Pain: 4  Pain Goal: 7 [facility] wants you to be able to say your pain was always managed very well.  [name],[name] 07/10/2017

## 2017-07-10 NOTE — H&P (Addendum)
HPI: Felicia Shepherd is a 27 y.o. G2P1001 at 44w3dwho presents to maternity admissions reporting abdominal pain and Hypertension. She reports being seen today in the office for routine OB visit and being told of hypertension of 140s/90s and platelets 117. She reports abdominal pain started last night around 2100. She reports upper abdominal pain in epigastric region that radiates to right arm and right shoulder. She rates abdominal pain 10/10. She denies HA and vision changes currently, reports having floaters yesterday and this morning but did not know that was related to hypertension. She denies lower abdominal cramping or contractions. She reports good fetal movement, denies LOF or vaginal bleeding.      Past Medical History:       Past Medical History:    Diagnosis   Date    .   ADHD (attention deficit hyperactivity disorder)        .   Allergy        .   Anxiety              Past obstetric history:                   OB History    Gravida   Para   Term   Preterm   AB   Living    2   1   1    0   0   1    SAB   TAB   Ectopic   Multiple   Live Births    0   0   0   0   1         #   Outcome   Date   GA   Lbr Len/2nd   Weight   Sex   Delivery   Anes   PTL   Lv    2   Current                                        1   Term   11/13/12   [redacted]w[redacted]d   09:35 / 02:40   5 lb 7.3 oz (2.475 kg)   M   Vag-Spont   EPI       LIV       Birth Comments: caput          Past Surgical History:        Past Surgical History:    Procedure   Laterality   Date    .   ADENOIDECTOMY       2009    .   TONSILLECTOMY            .   TONSILLECTOMY       2009          Family History:        Family History    Problem   Relation   Age of Onset    .   Diabetes   Maternal Grandmother        .   Diabetes    Maternal Grandfather        .   Heart attack   Maternal Grandfather        .   Diabetes   Mother        .   Anxiety disorder   Mother        .  Depression   Mother        .   Alcohol abuse   Father        .   ADD / ADHD   Sister        .   Lymphoma   Paternal Grandmother              Social History:   Social History             Tobacco Use    .   Smoking status:   Former Smoker            Packs/day:   0.25            Types:   Cigarettes            Start date:   05/08/2013    .   Smokeless tobacco:   Never Used    .   Tobacco comment: 2016    Substance Use Topics    .   Alcohol use:   No            Alcohol/week:   0.0 oz    .   Drug use:   No          Allergies:        Allergies    Allergen   Reactions    .   Keflex [Cephalexin]   Hives and Other (See Comments)          Meds:           Medications Prior to Admission    Medication   Sig   Dispense   Refill   Last Dose    .   escitalopram (LEXAPRO) 10 MG tablet   Take 1.5 tablets (15 mg total) by mouth daily.   45 tablet   1        .   methylphenidate (CONCERTA) 27 MG PO CR tablet   Take 1 tablet (27 mg total) by mouth every morning. To be filled 08/20/15   30 tablet   0        .   Multiple Vitamin (MULTIVITAMIN) tablet   Take 1 tablet by mouth daily.           Taking          ROS:   Review of Systems   Constitutional:        Hypertension   Respiratory: Negative.    Cardiovascular: Negative.    Gastrointestinal: Positive for abdominal pain. Negative for constipation, diarrhea, nausea and vomiting.   Genitourinary: Negative.       I have reviewed patient's Past Medical Hx, Surgical Hx, Family Hx, Social Hx, medications and allergies.       Physical Exam       Patient Vitals for the past 24 hrs:        BP   Temp   Temp src   Pulse   Resp   Height   Weight    07/10/17 0327   (!) 158/87   -   -   (!) 50   18   -   -    07/10/17 0301   131/78   -   -   (!) 59   17   -   -    07/10/17 0251   136/78   -   -   (!) 52   17   -   -    07/10/17 1610   Marland Kitchen)  142/71   -   -   (!) 52   18   -   -    07/10/17 0241   (!) 166/92   -   -   (!) 52   18   -   -    07/10/17 0240   (!) 163/92   -   -   (!) 50   17   -   -    07/10/17 0220   (!) 148/89   -   -   (!) 54   17   -   -    07/10/17 0216   (!) 160/82   -   -   (!) 52   17   -   -    07/10/17 0201   (!) 171/91   -   -   (!) 57   -   -   -    07/10/17 0200   (!) 169/93   -   -   (!) 55   18   -   -    07/10/17 0152   (!) 160/92   -   -   (!) 56   -   -   -    07/10/17 0150   (!) 152/98   -   -   63   -   -   -    07/10/17 0144   (!) 169/100   -   -   (!) 58   -   -   -    07/10/17 0142   (!) 168/95   -   -   (!) 59   -   -   -    07/10/17 0141   (!) 176/95   -   -   (!) 55   -   -   -    07/10/17 0123   (!) 174/94   97.9 F (36.6 C)   Oral   (!) 59   20   5\' 1"  (1.549 m)   219 lb 12 oz (99.7 kg)       Constitutional: Well-developed, well-nourished female in no acute distress.   Cardiovascular: normal rate  Respiratory: normal effort  GI: Abd soft, tender in epigastric region and RUQ, gravid appropriate for gestational age.   MS: Extremities nontender, +2 pedal and ankle edema bilaterally, normal ROM  Neurologic: Alert and oriented x 4.      FHT:  Baseline 130 , moderate variability, accelerations present, no decelerations  Contractions: q 5-8 mins/ mild by palpation      Labs:  Lab Results Last 24 Hours  O/Positive/-- (11/05 0000)     MAU Course/MDM:      Orders Placed This Encounter    Procedures    .   Protein / creatinine ratio, urine    .   Urinalysis, Routine w reflex microscopic    .   CBC    .   Comprehensive metabolic panel    .   Check blood pressure 20 minutes after giving hydrALAZINE 10 mg IV dose. Call MD if SBP >/= 160 and/or DBP >/= 110.    .   Once BP goal is reached, repeat BP every 10 minutes for 1 hour, then every 15 minutes for 1 hours, then per policy for antepartum labor or post-partum.    .   Insert peripheral IV       Pre E labs: Platelets 112, Elevated livers, PCR 1.45          Meds ordered this encounter    Medications    .    labetalol (NORMODYNE,TRANDATE) injection 20-80 mg    .   hydrALAZINE (APRESOLINE) injection 10 mg    .   lactated ringers infusion    .   magnesium bolus via infusion 4 g    .   magnesium sulfate 40 grams in LR 500 mL OB infusion       NST reviewed- reactive      Treatments in MAU included LR 125 IV, Preeclampsia focused protocol with Labetalol IV push. 2 doses of Labetalol given IV prior to initiation of Magnesium.      Consult Dr Henderson Cloud with presentation, exam findings and test results. Admit to L&D for IOL- Severe Preeclampsia, initiate Magnesium and Pitocin 2x2 for IOL. Orders placed.      Assessment:   1.   Severe preeclampsia, third trimester           Plan:  Admit to L&D  Care taken over by Dr Henderson Cloud   Orders placed for admission and IOL        Agree with above Patient had bad epigastric pain>tried GI cocktail and she had N/V, tried sodium citrate and she had N/V, gave Pepcid IV>relief. No HA or vision changes. Pregnancy uncomplicated until BP elevations. Had severe range BP in MAU requiring treatment  PE  Vitals:   07/10/17 0559 07/10/17 0609  BP: (!) 180/97 (!) 153/80  Pulse: 61 (!) 56  Resp: (!) 24 (!) 22  Temp:     smiling in NAD Abdomen soft, no epigastric tenderness Uterus soft, NT DTR 2+ without clonus Cx 3/70/-3/vtx/well applied AROM clear  FHT cat one UCs irregular (pitocin running)  Results for orders placed or performed during the hospital encounter of 07/10/17 (from the past 24 hour(s))  Protein / creatinine ratio, urine     Status: Abnormal   Collection Time: 07/10/17  1:26 AM  Result Value Ref Range   Creatinine, Urine 104.00 mg/dL   Total Protein, Urine 151 mg/dL   Protein Creatinine Ratio 1.45 (H) 0.00 - 0.15 mg/mg[Cre]  Urinalysis, Routine w reflex microscopic     Status: Abnormal   Collection Time: 07/10/17  1:26 AM  Result Value Ref Range   Color, Urine YELLOW YELLOW   APPearance CLEAR  CLEAR   Specific Gravity, Urine 1.017 1.005 - 1.030   pH 6.0 5.0 - 8.0   Glucose, UA NEGATIVE NEGATIVE mg/dL   Hgb urine dipstick SMALL (A) NEGATIVE   Bilirubin Urine NEGATIVE NEGATIVE   Ketones, ur NEGATIVE NEGATIVE mg/dL   Protein, ur  100 (A) NEGATIVE mg/dL   Nitrite NEGATIVE NEGATIVE   Leukocytes, UA TRACE (A) NEGATIVE   RBC / HPF 0-5 0 - 5 RBC/hpf   WBC, UA 11-20 0 - 5 WBC/hpf   Bacteria, UA RARE (A) NONE SEEN   Squamous Epithelial / LPF 6-10 0 - 5   Mucus PRESENT   CBC     Status: Abnormal   Collection Time: 07/10/17  2:30 AM  Result Value Ref Range   WBC 13.3 (H) 4.0 - 10.5 K/uL   RBC 4.35 3.87 - 5.11 MIL/uL   Hemoglobin 12.0 12.0 - 15.0 g/dL   HCT 16.135.8 (L) 09.636.0 - 04.546.0 %   MCV 82.3 78.0 - 100.0 fL   MCH 27.6 26.0 - 34.0 pg   MCHC 33.5 30.0 - 36.0 g/dL   RDW 40.914.0 81.111.5 - 91.415.5 %   Platelets 112 (L) 150 - 400 K/uL  Comprehensive metabolic panel     Status: Abnormal   Collection Time: 07/10/17  2:30 AM  Result Value Ref Range   Sodium 135 135 - 145 mmol/L   Potassium 4.2 3.5 - 5.1 mmol/L   Chloride 105 101 - 111 mmol/L   CO2 20 (L) 22 - 32 mmol/L   Glucose, Bld 95 65 - 99 mg/dL   BUN 13 6 - 20 mg/dL   Creatinine, Ser 7.820.71 0.44 - 1.00 mg/dL   Calcium 9.0 8.9 - 95.610.3 mg/dL   Total Protein 6.6 6.5 - 8.1 g/dL   Albumin 2.9 (L) 3.5 - 5.0 g/dL   AST 69 (H) 15 - 41 U/L   ALT 65 (H) 14 - 54 U/L   Alkaline Phosphatase 254 (H) 38 - 126 U/L   Total Bilirubin 0.6 0.3 - 1.2 mg/dL   GFR calc non Af Amer >60 >60 mL/min   GFR calc Af Amer >60 >60 mL/min   Anion gap 10 5 - 15  Type and screen [facility]     Status: None   Collection Time: 07/10/17  2:30 AM  Result Value Ref Range   ABO/RH(D) O POS    Antibody Screen NEG    Sample Expiration      07/13/2017 Performed at [facility], [address] [place], [place] [id number]    Pitocin and magnesium sulfate infusing  A/P: HELLP          D/W patient magnesium sulfate for sz prophylaxis and indication  for delivery, magnesium PP.         She states she understands and agrees

## 2017-07-10 NOTE — Anesthesia Procedure Notes (Signed)
Epidural Patient location during procedure: OB Start time: 07/10/2017 10:10 AM End time: 07/10/2017 10:15 AM  Staffing Anesthesiologist: Bethena Midgetddono, Almedia Cordell, MD  Preanesthetic Checklist Completed: patient identified, site marked, surgical consent, pre-op evaluation, timeout performed, IV checked, risks and benefits discussed and monitors and equipment checked  Epidural Patient position: sitting Prep: site prepped and draped and DuraPrep Patient monitoring: continuous pulse ox and blood pressure Approach: midline Location: L4-L5 Injection technique: LOR air  Needle:  Needle type: Tuohy  Needle gauge: 17 G Needle length: 9 cm and 9 Needle insertion depth: 6 cm Catheter type: closed end flexible Catheter size: 19 Gauge Catheter at skin depth: 12 cm Test dose: negative  Assessment Events: blood not aspirated, injection not painful, no injection resistance, negative IV test and no paresthesia

## 2017-07-10 NOTE — Progress Notes (Signed)
No current c/o. Feeling increasing pain with CTX. Planning IV pain medication and then epidural after repeat CBC. Continue Mag 2g/hr and IV antihypertensives prn  Mitchel HonourMegan Shion Bluestein, DO

## 2017-07-10 NOTE — MAU Provider Note (Signed)
Chief Complaint:  Abdominal Pain   First Provider Initiated Contact with Patient 07/10/17 0153      HPI: Felicia Shepherd is a 27 y.o. G2P1001 at 27w3dwho presents to maternity admissions reporting abdominal pain and Hypertension. She reports being seen today in the office for routine OB visit and being told of hypertension of 140s/90s and platelets 117. She reports abdominal pain started last night around 2100. She reports upper abdominal pain in epigastric region that radiates to right arm and right shoulder. She rates abdominal pain 10/10. She denies HA and vision changes currently, reports having floaters yesterday and this morning but did not know that was related to hypertension. She denies lower abdominal cramping or contractions. She reports good fetal movement, denies LOF or vaginal bleeding.   Past Medical History: Past Medical History:  Diagnosis Date  . ADHD (attention deficit hyperactivity disorder)   . Allergy   . Anxiety     Past obstetric history: OB History  Gravida Para Term Preterm AB Living  2 1 1  0 0 1  SAB TAB Ectopic Multiple Live Births  0 0 0 0 1    # Outcome Date GA Lbr Len/2nd Weight Sex Delivery Anes PTL Lv  2 Current           1 Term 11/13/12 [redacted]w[redacted]d 09:35 / 02:40 5 lb 7.3 oz (2.475 kg) M Vag-Spont EPI  LIV     Birth Comments: caput    Past Surgical History: Past Surgical History:  Procedure Laterality Date  . ADENOIDECTOMY  2009  . TONSILLECTOMY    . TONSILLECTOMY  2009    Family History: Family History  Problem Relation Age of Onset  . Diabetes Maternal Grandmother   . Diabetes Maternal Grandfather   . Heart attack Maternal Grandfather   . Diabetes Mother   . Anxiety disorder Mother   . Depression Mother   . Alcohol abuse Father   . ADD / ADHD Sister   . Lymphoma Paternal Grandmother     Social History: Social History   Tobacco Use  . Smoking status: Former Smoker    Packs/day: 0.25    Types: Cigarettes    Start date: 05/08/2013  .  Smokeless tobacco: Never Used  . Tobacco comment: 2016  Substance Use Topics  . Alcohol use: No    Alcohol/week: 0.0 oz  . Drug use: No    Allergies:  Allergies  Allergen Reactions  . Keflex [Cephalexin] Hives and Other (See Comments)    Meds:  Medications Prior to Admission  Medication Sig Dispense Refill Last Dose  . escitalopram (LEXAPRO) 10 MG tablet Take 1.5 tablets (15 mg total) by mouth daily. 45 tablet 1   . methylphenidate (CONCERTA) 27 MG PO CR tablet Take 1 tablet (27 mg total) by mouth every morning. To be filled 08/20/15 30 tablet 0   . Multiple Vitamin (MULTIVITAMIN) tablet Take 1 tablet by mouth daily.   Taking    ROS:  Review of Systems  Constitutional:       Hypertension  Respiratory: Negative.   Cardiovascular: Negative.   Gastrointestinal: Positive for abdominal pain. Negative for constipation, diarrhea, nausea and vomiting.  Genitourinary: Negative.    I have reviewed patient's Past Medical Hx, Surgical Hx, Family Hx, Social Hx, medications and allergies.   Physical Exam   Patient Vitals for the past 24 hrs:  BP Temp Temp src Pulse Resp Height Weight  07/10/17 0327 (!) 158/87 - - (!) 50 18 - -  07/10/17 0301  131/78 - - (!) 59 17 - -  07/10/17 0251 136/78 - - (!) 52 17 - -  07/10/17 0247 (!) 142/71 - - (!) 52 18 - -  07/10/17 0241 (!) 166/92 - - (!) 52 18 - -  07/10/17 0240 (!) 163/92 - - (!) 50 17 - -  07/10/17 0220 (!) 148/89 - - (!) 54 17 - -  07/10/17 0216 (!) 160/82 - - (!) 52 17 - -  07/10/17 0201 (!) 171/91 - - (!) 57 - - -  07/10/17 0200 (!) 169/93 - - (!) 55 18 - -  07/10/17 0152 (!) 160/92 - - (!) 56 - - -  07/10/17 0150 (!) 152/98 - - 63 - - -  07/10/17 0144 (!) 169/100 - - (!) 58 - - -  07/10/17 0142 (!) 168/95 - - (!) 59 - - -  07/10/17 0141 (!) 176/95 - - (!) 55 - - -  07/10/17 0123 (!) 174/94 97.9 F (36.6 C) Oral (!) 59 20 5\' 1"  (1.549 m) 219 lb 12 oz (99.7 kg)   Constitutional: Well-developed, well-nourished female in no acute  distress.  Cardiovascular: normal rate Respiratory: normal effort GI: Abd soft, tender in epigastric region and RUQ, gravid appropriate for gestational age.  MS: Extremities nontender, +2 pedal and ankle edema bilaterally, normal ROM Neurologic: Alert and oriented x 4.   FHT:  Baseline 130 , moderate variability, accelerations present, no decelerations Contractions: q 5-8 mins/ mild by palpation    Labs: Results for orders placed or performed during the hospital encounter of 07/10/17 (from the past 24 hour(s))  Protein / creatinine ratio, urine     Status: Abnormal   Collection Time: 07/10/17  1:26 AM  Result Value Ref Range   Creatinine, Urine 104.00 mg/dL   Total Protein, Urine 151 mg/dL   Protein Creatinine Ratio 1.45 (H) 0.00 - 0.15 mg/mg[Cre]  Urinalysis, Routine w reflex microscopic     Status: Abnormal   Collection Time: 07/10/17  1:26 AM  Result Value Ref Range   Color, Urine YELLOW YELLOW   APPearance CLEAR CLEAR   Specific Gravity, Urine 1.017 1.005 - 1.030   pH 6.0 5.0 - 8.0   Glucose, UA NEGATIVE NEGATIVE mg/dL   Hgb urine dipstick SMALL (A) NEGATIVE   Bilirubin Urine NEGATIVE NEGATIVE   Ketones, ur NEGATIVE NEGATIVE mg/dL   Protein, ur 161100 (A) NEGATIVE mg/dL   Nitrite NEGATIVE NEGATIVE   Leukocytes, UA TRACE (A) NEGATIVE   RBC / HPF 0-5 0 - 5 RBC/hpf   WBC, UA 11-20 0 - 5 WBC/hpf   Bacteria, UA RARE (A) NONE SEEN   Squamous Epithelial / LPF 6-10 0 - 5   Mucus PRESENT   CBC     Status: Abnormal   Collection Time: 07/10/17  2:30 AM  Result Value Ref Range   WBC 13.3 (H) 4.0 - 10.5 K/uL   RBC 4.35 3.87 - 5.11 MIL/uL   Hemoglobin 12.0 12.0 - 15.0 g/dL   HCT 09.635.8 (L) 04.536.0 - 40.946.0 %   MCV 82.3 78.0 - 100.0 fL   MCH 27.6 26.0 - 34.0 pg   MCHC 33.5 30.0 - 36.0 g/dL   RDW 81.114.0 91.411.5 - 78.215.5 %   Platelets 112 (L) 150 - 400 K/uL  Comprehensive metabolic panel     Status: Abnormal   Collection Time: 07/10/17  2:30 AM  Result Value Ref Range   Sodium 135 135 - 145  mmol/L   Potassium 4.2 3.5 -  5.1 mmol/L   Chloride 105 101 - 111 mmol/L   CO2 20 (L) 22 - 32 mmol/L   Glucose, Bld 95 65 - 99 mg/dL   BUN 13 6 - 20 mg/dL   Creatinine, Ser 6.96 0.44 - 1.00 mg/dL   Calcium 9.0 8.9 - 29.5 mg/dL   Total Protein 6.6 6.5 - 8.1 g/dL   Albumin 2.9 (L) 3.5 - 5.0 g/dL   AST 69 (H) 15 - 41 U/L   ALT 65 (H) 14 - 54 U/L   Alkaline Phosphatase 254 (H) 38 - 126 U/L   Total Bilirubin 0.6 0.3 - 1.2 mg/dL   GFR calc non Af Amer >60 >60 mL/min   GFR calc Af Amer >60 >60 mL/min   Anion gap 10 5 - 15  Type and screen [facility]     Status: None   Collection Time: 07/10/17  2:30 AM  Result Value Ref Range   ABO/RH(D) O POS    Antibody Screen NEG    Sample Expiration      07/13/2017 Performed at [facility], [address], [place], [place] [address]    O/Positive/-- (11/05 0000)  MAU Course/MDM: Orders Placed This Encounter  Procedures  . Protein / creatinine ratio, urine  . Urinalysis, Routine w reflex microscopic  . CBC  . Comprehensive metabolic panel  . Check blood pressure 20 minutes after giving hydrALAZINE 10 mg IV dose. Call MD if SBP >/= 160 and/or DBP >/= 110.  . Once BP goal is reached, repeat BP every 10 minutes for 1 hour, then every 15 minutes for 1 hours, then per policy for antepartum labor or post-partum.  . Insert peripheral IV   Pre E labs: Platelets 112, Elevated livers, PCR 1.45   Meds ordered this encounter  Medications  . labetalol (NORMODYNE,TRANDATE) injection 20-80 mg  . hydrALAZINE (APRESOLINE) injection 10 mg  . lactated ringers infusion  . magnesium bolus via infusion 4 g  . magnesium sulfate 40 grams in LR 500 mL OB infusion   NST reviewed- reactive   Treatments in MAU included LR 125 IV, Preeclampsia focused protocol with Labetalol IV push. 2 doses of Labetalol given IV prior to initiation of Magnesium.   Consult Dr Henderson Cloud with presentation, exam findings and test results. Admit to L&D for IOL-  Severe Preeclampsia, initiate Magnesium and Pitocin 2x2 for IOL. Orders placed.    Assessment: 1. Severe preeclampsia, third trimester     Plan: Admit to L&D Care taken over by Dr Henderson Cloud  Orders placed for admission and IOL    Steward Drone Certified Nurse-Midwife 07/10/2017 3:10 AM

## 2017-07-11 LAB — COMPREHENSIVE METABOLIC PANEL
ALBUMIN: 2.5 g/dL — AB (ref 3.5–5.0)
ALK PHOS: 206 U/L — AB (ref 38–126)
ALT: 93 U/L — AB (ref 14–54)
AST: 63 U/L — ABNORMAL HIGH (ref 15–41)
Anion gap: 8 (ref 5–15)
BUN: 9 mg/dL (ref 6–20)
CALCIUM: 6.7 mg/dL — AB (ref 8.9–10.3)
CHLORIDE: 104 mmol/L (ref 101–111)
CO2: 23 mmol/L (ref 22–32)
CREATININE: 0.78 mg/dL (ref 0.44–1.00)
GFR calc non Af Amer: >60 mL/min (ref >60)
GLUCOSE: 82 mg/dL (ref 65–99)
Potassium: 3.9 mmol/L (ref 3.5–5.1)
Sodium: 135 mmol/L (ref 135–145)
Total Bilirubin: 0.3 mg/dL (ref 0.3–1.2)
Total Protein: 5.3 g/dL — ABNORMAL LOW (ref 6.5–8.1)

## 2017-07-11 LAB — CBC
HEMATOCRIT: 31.6 % — AB (ref 36.0–46.0)
Hemoglobin: 10.4 g/dL — ABNORMAL LOW (ref 12.0–15.0)
MCH: 27.5 pg (ref 26.0–34.0)
MCHC: 32.9 g/dL (ref 30.0–36.0)
MCV: 83.6 fL (ref 78.0–100.0)
Platelets: 74 10*3/uL — ABNORMAL LOW (ref 150–400)
RBC: 3.78 MIL/uL — ABNORMAL LOW (ref 3.87–5.11)
RDW: 14.7 % (ref 11.5–15.5)
WBC: 9.6 10*3/uL (ref 4.0–10.5)

## 2017-07-11 MED ORDER — LACTATED RINGERS IV SOLN
INTRAVENOUS | Status: DC
  Administered 2017-07-11: 08:00:00 via INTRAVENOUS

## 2017-07-11 NOTE — Lactation Note (Signed)
This note was copied from a baby's chart. Lactation Consultation Note Checking on mom and feeding. Mom using #20 NS inserting formula into NS w/curve tip syring.  Mom has large nipples using #20 NS. Mom stated she felt the #24 was to big for the baby's mouth. NS #20 fit. Asked mom if she has seen colostrum in NS. Mom stated she hasn't noticed. Encouraged mom to apply NS and BF first before inserting formula into NS. LC wants to see if colostrum in NS.assessing for transfer. Sending mom an extras NS.  Mom has DEBP, encouraged to use for stimulations.  Patient Name: Felicia Fidela JuneauSarah Goodgame ZOXWR'UToday's Date: 07/11/2017 Reason for consult: Follow-up assessment   Maternal Data    Feeding Feeding Type: Breast Fed Length of feed: 15 min  LATCH Score                   Interventions Interventions: Breast feeding basics reviewed;Support pillows;Assisted with latch;Position options;Skin to skin;Expressed milk;Breast massage;Hand express;Hand pump;Breast compression;Adjust position  Lactation Tools Discussed/Used     Consult Status Consult Status: Follow-up Date: 07/12/17 Follow-up type: In-patient    Charyl DancerCARVER, Schawn Byas G 07/11/2017, 11:11 PM

## 2017-07-11 NOTE — Lactation Note (Signed)
This note was copied from a baby's chart. Lactation Consultation Note  Patient Name: Felicia Fidela JuneauSarah Shepherd EAVWU'JToday's Date: 07/11/2017 Reason for consult: Initial assessment;Difficult latch;Infant < 6lbs   Mom had previous child in NICU for 10 days so pumped for 3 months with first, now 4 yrs. Old.   Mom desires to bf exclusively without bottle feeding; Mom has pumped twice with DEBP and was able to get out 10 ml with second pump.  Mom was taught hand expression.  With LC help expressed 7ml into snappie.    Infant in blankets, LC encouraged STS, infant unable to latch due to tongue thrusting, and tongue sucking.  Hand expression used, LC had infant suck with curved tip syringe to encourage better tongue movement.  Continued struggle with latch so LC gave mom 24 NS that was too large, but #20 was a good fit.  Mom and Dad were taught how to apply shield and how to prefill with EBM.  Infant latched instantly and deep rhythmic jaw movement seen.  Arms of infant relaxed after feed.  LC demonstrated how to finger feed with curved tip syringe; infant took 7ml.    LPT infant sheet given to parents and explained that even though infant was term he may display characteristics of LPT.   Mom and dad know to supplement after breastfeeds with EBM with curved tip syringe.  Ranges reviewed with mom and dad.    Mom knows to use DEBP after feeds for stimulation to infant can be supplemented and because of NS use. REviewed BF basics feed 8-12 times in 24 hours and not to allow infant to go longer than 3 hours without feeds.     BFSG reviewed and OP services encouraged.  Switched flanges to 27 because mom expressed discomfort with the 24 when pumping.Encouraged mom to call out for further assistance and questions.    Maternal Data Formula Feeding for Exclusion: No Has patient been taught Hand Expression?: Yes Does the patient have breastfeeding experience prior to this delivery?: (pumped for three months (now 27 yr old was  in NICU for 10 days))  Feeding Feeding Type: Breast Fed Length of feed: 15 min(curved tip (7 ml))  LATCH Score Latch: Repeated attempts needed to sustain latch, nipple held in mouth throughout feeding, stimulation needed to elicit sucking reflex.  Audible Swallowing: A few with stimulation  Type of Nipple: Everted at rest and after stimulation(shorter shaft/thicker tough nipple difficulty latching)  Comfort (Breast/Nipple): Soft / non-tender  Hold (Positioning): Assistance needed to correctly position infant at breast and maintain latch.  LATCH Score: 7  Interventions Interventions: Breast feeding basics reviewed;Assisted with latch;Skin to skin;Breast massage;Hand express;Breast compression;Hand pump;Expressed milk;Position options;Support pillows;DEBP  Lactation Tools Discussed/Used Tools: Flanges;Nipple Shields Nipple shield size: 20(tried 24, too big/20 great fit and infant latched well) Flange Size: 27 WIC Program: No Pump Review: Milk Storage   Consult Status Consult Status: Follow-up Date: 07/12/17 Follow-up type: In-patient    Maryruth HancockKelly Suzanne [facility] 07/11/2017, 11:27 AM

## 2017-07-11 NOTE — Plan of Care (Signed)
  Problem: Nutrition: Goal: Adequate nutrition will be maintained Outcome: Progressing   Problem: Pain Managment: Goal: General experience of comfort will improve Outcome: Progressing   Problem: Safety: Goal: Ability to remain free from injury will improve Outcome: Progressing   Problem: Role Relationship: Goal: Ability to demonstrate positive interaction with newborn will improve Outcome: Progressing

## 2017-07-11 NOTE — Progress Notes (Signed)
Post Partum Day 1 Subjective: no complaints and tolerating PO.  No HA, nv or visual changes.  Having some breastfeeding challenges.  Epidural still in place  Objective: Blood pressure 112/77, pulse 71, temperature 98.3 F (36.8 C), temperature source Oral, resp. rate 18, height 5\' 1"  (1.549 m), weight 219 lb 12 oz (99.7 kg), last menstrual period 10/14/2016, SpO2 100 %, unknown if currently breastfeeding.  Physical Exam:  General: alert and cooperative Lochia: appropriate Uterine Fundus: firm Incision: n/a DVT Evaluation: No evidence of DVT seen on physical exam.  Recent Labs    07/10/17 1951 07/11/17 0726  HGB 10.6* 10.4*  HCT 31.3* 31.6*   Results for orders placed or performed during the hospital encounter of 07/10/17 (from the past 24 hour(s))  Comprehensive metabolic panel     Status: Abnormal   Collection Time: 07/10/17  9:05 AM  Result Value Ref Range   Sodium 133 (L) 135 - 145 mmol/L   Potassium 4.3 3.5 - 5.1 mmol/L   Chloride 102 101 - 111 mmol/L   CO2 18 (L) 22 - 32 mmol/L   Glucose, Bld 123 (H) 65 - 99 mg/dL   BUN 11 6 - 20 mg/dL   Creatinine, Ser 1.610.70 0.44 - 1.00 mg/dL   Calcium 8.1 (L) 8.9 - 10.3 mg/dL   Total Protein 6.7 6.5 - 8.1 g/dL   Albumin 2.9 (L) 3.5 - 5.0 g/dL   AST 096186 (H) 15 - 41 U/L   ALT 147 (H) 14 - 54 U/L   Alkaline Phosphatase 260 (H) 38 - 126 U/L   Total Bilirubin 0.6 0.3 - 1.2 mg/dL   GFR calc non Af Amer >60 >60 mL/min   GFR calc Af Amer >60 >60 mL/min   Anion gap 13 5 - 15  CBC     Status: Abnormal   Collection Time: 07/10/17  9:05 AM  Result Value Ref Range   WBC 14.5 (H) 4.0 - 10.5 K/uL   RBC 4.13 3.87 - 5.11 MIL/uL   Hemoglobin 11.6 (L) 12.0 - 15.0 g/dL   HCT 04.533.9 (L) 40.936.0 - 81.146.0 %   MCV 82.1 78.0 - 100.0 fL   MCH 28.1 26.0 - 34.0 pg   MCHC 34.2 30.0 - 36.0 g/dL   RDW 91.414.2 78.211.5 - 95.615.5 %   Platelets 84 (L) 150 - 400 K/uL  CBC     Status: Abnormal   Collection Time: 07/10/17  4:51 PM  Result Value Ref Range   WBC 14.4 (H) 4.0  - 10.5 K/uL   RBC 4.12 3.87 - 5.11 MIL/uL   Hemoglobin 11.4 (L) 12.0 - 15.0 g/dL   HCT 21.333.7 (L) 08.636.0 - 57.846.0 %   MCV 81.8 78.0 - 100.0 fL   MCH 27.7 26.0 - 34.0 pg   MCHC 33.8 30.0 - 36.0 g/dL   RDW 46.914.3 62.911.5 - 52.815.5 %   Platelets 62 (L) 150 - 400 K/uL  Comprehensive metabolic panel     Status: Abnormal   Collection Time: 07/10/17  4:51 PM  Result Value Ref Range   Sodium 132 (L) 135 - 145 mmol/L   Potassium 4.4 3.5 - 5.1 mmol/L   Chloride 100 (L) 101 - 111 mmol/L   CO2 22 22 - 32 mmol/L   Glucose, Bld 127 (H) 65 - 99 mg/dL   BUN 9 6 - 20 mg/dL   Creatinine, Ser 4.130.63 0.44 - 1.00 mg/dL   Calcium 7.4 (L) 8.9 - 10.3 mg/dL   Total Protein 5.8 (L) 6.5 -  8.1 g/dL   Albumin 2.7 (L) 3.5 - 5.0 g/dL   AST 161 (H) 15 - 41 U/L   ALT 133 (H) 14 - 54 U/L   Alkaline Phosphatase 241 (H) 38 - 126 U/L   Total Bilirubin 0.7 0.3 - 1.2 mg/dL   GFR calc non Af Amer >60 >60 mL/min   GFR calc Af Amer >60 >60 mL/min   Anion gap 10 5 - 15  CBC     Status: Abnormal   Collection Time: 07/10/17  7:51 PM  Result Value Ref Range   WBC 12.4 (H) 4.0 - 10.5 K/uL   RBC 3.81 (L) 3.87 - 5.11 MIL/uL   Hemoglobin 10.6 (L) 12.0 - 15.0 g/dL   HCT 09.6 (L) 04.5 - 40.9 %   MCV 82.2 78.0 - 100.0 fL   MCH 27.8 26.0 - 34.0 pg   MCHC 33.9 30.0 - 36.0 g/dL   RDW 81.1 91.4 - 78.2 %   Platelets 60 (L) 150 - 400 K/uL  CBC     Status: Abnormal   Collection Time: 07/11/17  7:26 AM  Result Value Ref Range   WBC 9.6 4.0 - 10.5 K/uL   RBC 3.78 (L) 3.87 - 5.11 MIL/uL   Hemoglobin 10.4 (L) 12.0 - 15.0 g/dL   HCT 95.6 (L) 21.3 - 08.6 %   MCV 83.6 78.0 - 100.0 fL   MCH 27.5 26.0 - 34.0 pg   MCHC 32.9 30.0 - 36.0 g/dL   RDW 57.8 46.9 - 62.9 %   Platelets 74 (L) 150 - 400 K/uL  Comprehensive metabolic panel     Status: Abnormal   Collection Time: 07/11/17  7:26 AM  Result Value Ref Range   Sodium 135 135 - 145 mmol/L   Potassium 3.9 3.5 - 5.1 mmol/L   Chloride 104 101 - 111 mmol/L   CO2 23 22 - 32 mmol/L   Glucose, Bld  82 65 - 99 mg/dL   BUN 9 6 - 20 mg/dL   Creatinine, Ser 5.28 0.44 - 1.00 mg/dL   Calcium 6.7 (L) 8.9 - 10.3 mg/dL   Total Protein 5.3 (L) 6.5 - 8.1 g/dL   Albumin 2.5 (L) 3.5 - 5.0 g/dL   AST 63 (H) 15 - 41 U/L   ALT 93 (H) 14 - 54 U/L   Alkaline Phosphatase 206 (H) 38 - 126 U/L   Total Bilirubin 0.3 0.3 - 1.2 mg/dL   GFR calc non Af Amer >60 >60 mL/min   GFR calc Af Amer >60 >60 mL/min   Anion gap 8 5 - 15   Assessment/Plan: PPD#1 with HELLP syndrome BP stable and labs improving D/c magnesium today - monitor BP closely   LOS: 1 day   Felicia Shepherd 07/11/2017, 8:27 AM

## 2017-07-11 NOTE — Progress Notes (Signed)
MOB was referred for history of depression/anxiety. * Referral screened out by Clinical Social Worker because none of the following criteria appear to apply: ~ History of anxiety/depression during this pregnancy, or of post-partum depression. ~ Diagnosis of anxiety and/or depression within last 3 years OR * MOB's symptoms currently being treated with medication and/or therapy. Please contact the Clinical Social Worker if needs arise, by MOB request, or if MOB scores greater than 9/yes to question 10 on Edinburgh Postpartum Depression Screen.  Rowyn Spilde Boyd-Gilyard, MSW, LCSW Clinical Social Work [phone] 

## 2017-07-12 LAB — COMPREHENSIVE METABOLIC PANEL
ALK PHOS: 196 U/L — AB (ref 38–126)
ALT: 61 U/L — AB (ref 14–54)
ALT: 61 U/L — AB (ref 14–54)
AST: 31 U/L (ref 15–41)
AST: 33 U/L (ref 15–41)
Albumin: 2.6 g/dL — ABNORMAL LOW (ref 3.5–5.0)
Albumin: 2.8 g/dL — ABNORMAL LOW (ref 3.5–5.0)
Alkaline Phosphatase: 176 U/L — ABNORMAL HIGH (ref 38–126)
Anion gap: 10 (ref 5–15)
Anion gap: 9 (ref 5–15)
BILIRUBIN TOTAL: 0.2 mg/dL — AB (ref 0.3–1.2)
BUN: 13 mg/dL (ref 6–20)
BUN: 14 mg/dL (ref 6–20)
CALCIUM: 8.7 mg/dL — AB (ref 8.9–10.3)
CHLORIDE: 104 mmol/L (ref 101–111)
CHLORIDE: 105 mmol/L (ref 101–111)
CO2: 23 mmol/L (ref 22–32)
CO2: 24 mmol/L (ref 22–32)
CREATININE: 0.68 mg/dL (ref 0.44–1.00)
CREATININE: 0.8 mg/dL (ref 0.44–1.00)
Calcium: 7.9 mg/dL — ABNORMAL LOW (ref 8.9–10.3)
GFR calc non Af Amer: >60 mL/min (ref >60)
Glucose, Bld: 111 mg/dL — ABNORMAL HIGH (ref 65–99)
Glucose, Bld: 112 mg/dL — ABNORMAL HIGH (ref 65–99)
Potassium: 4.2 mmol/L (ref 3.5–5.1)
Potassium: 4.7 mmol/L (ref 3.5–5.1)
SODIUM: 137 mmol/L (ref 135–145)
Sodium: 138 mmol/L (ref 135–145)
TOTAL PROTEIN: 6.5 g/dL (ref 6.5–8.1)
Total Bilirubin: 0.3 mg/dL (ref 0.3–1.2)
Total Protein: 5.8 g/dL — ABNORMAL LOW (ref 6.5–8.1)

## 2017-07-12 LAB — CBC
HCT: 30.8 % — ABNORMAL LOW (ref 36.0–46.0)
HEMATOCRIT: 32.6 % — AB (ref 36.0–46.0)
Hemoglobin: 9.9 g/dL — ABNORMAL LOW (ref 12.0–15.0)
Hemoglobin: 10.5 g/dL — ABNORMAL LOW (ref 12.0–15.0)
MCH: 27.5 pg (ref 26.0–34.0)
MCH: 27.6 pg (ref 26.0–34.0)
MCHC: 32.1 g/dL (ref 30.0–36.0)
MCHC: 32.2 g/dL (ref 30.0–36.0)
MCV: 85.6 fL (ref 78.0–100.0)
MCV: 85.6 fL (ref 78.0–100.0)
PLATELETS: 98 10*3/uL — AB (ref 150–400)
Platelets: 88 10*3/uL — ABNORMAL LOW (ref 150–400)
RBC: 3.6 MIL/uL — AB (ref 3.87–5.11)
RBC: 3.81 MIL/uL — AB (ref 3.87–5.11)
RDW: 15.3 % (ref 11.5–15.5)
RDW: 15.1 % (ref 11.5–15.5)
WBC: 10.8 10*3/uL — AB (ref 4.0–10.5)
WBC: 11.6 10*3/uL — ABNORMAL HIGH (ref 4.0–10.5)

## 2017-07-12 MED ORDER — SODIUM CHLORIDE 0.9 % IV SOLN: Freq: Once | INTRAVENOUS | Status: DC

## 2017-07-12 NOTE — Progress Notes (Signed)
Some soreness around epidural catheter  Vitals:   07/12/17 0358 07/12/17 0804  BP: 107/65 115/68  Pulse: 83 77  Resp: 16 18  Temp: 98.5 F (36.9 C) 98.5 F (36.9 C)  SpO2: 97% 97%   FFNT  Results for orders placed or performed during the hospital encounter of 07/10/17 (from the past 24 hour(s))  CBC     Status: Abnormal   Collection Time: 07/12/17  6:19 AM  Result Value Ref Range   WBC 10.8 (H) 4.0 - 10.5 K/uL   RBC 3.60 (L) 3.87 - 5.11 MIL/uL   Hemoglobin 9.9 (L) 12.0 - 15.0 g/dL   HCT 16.130.8 (L) 09.636.0 - 04.546.0 %   MCV 85.6 78.0 - 100.0 fL   MCH 27.5 26.0 - 34.0 pg   MCHC 32.1 30.0 - 36.0 g/dL   RDW 40.915.3 81.111.5 - 91.415.5 %   Platelets 88 (L) 150 - 400 K/uL  Comprehensive metabolic panel     Status: Abnormal   Collection Time: 07/12/17  6:19 AM  Result Value Ref Range   Sodium 137 135 - 145 mmol/L   Potassium 4.2 3.5 - 5.1 mmol/L   Chloride 104 101 - 111 mmol/L   CO2 23 22 - 32 mmol/L   Glucose, Bld 111 (H) 65 - 99 mg/dL   BUN 13 6 - 20 mg/dL   Creatinine, Ser 7.820.68 0.44 - 1.00 mg/dL   Calcium 7.9 (L) 8.9 - 10.3 mg/dL   Total Protein 5.8 (L) 6.5 - 8.1 g/dL   Albumin 2.6 (L) 3.5 - 5.0 g/dL   AST 31 15 - 41 U/L   ALT 61 (H) 14 - 54 U/L   Alkaline Phosphatase 176 (H) 38 - 126 U/L   Total Bilirubin 0.3 0.3 - 1.2 mg/dL   GFR calc non Af Amer >60 >60 mL/min   GFR calc Af Amer >60 >60 mL/min   Anion gap 10 5 - 15   A/P: Hellp-resolving         Labs this afternoon

## 2017-07-12 NOTE — Plan of Care (Signed)
Patient sitting up in bed at this time. Denies any pain or discomfort. Patient has ambulated in her room without any difficulty.

## 2017-07-12 NOTE — Lactation Note (Signed)
This note was copied from a baby's chart. Lactation Consultation Note  Patient Name: Felicia Fidela JuneauSarah Tess WUJWJ'XToday's Date: 07/12/2017 Reason for consult: Follow-up assessment;Early term 37-38.6wks;Infant < 6lbs;Infant weight loss;Difficult latch(6% weight loss , )  Baby is 48 hours old and per parents probably won't go home until tomorrow due to temps.  Also baby recently fed and took 15 ml from a curved tip syringe..  LC recommended due to weight loss, <6 pounds , to supplement with a bottle.  LC reassessed sizing of the NS and increased mom to #24 due to pinky red nipples and the #20 NS is to small,  And also sized flange and the #24 is a better fit ( observed mom pumping ).  LC Plan change -  Feed 1st breast 15 -20 mins , supplement with a bottle - PACE feeding , and post pump 15 -20 mins both breast/ With #24 Flange and save milk for next feeding/ and hand express.   RN caring for baby and mom aware of the new LC plan.     Maternal Data Has patient been taught Hand Expression?: Yes  Feeding Feeding Type: Formula Nipple Type: Slow - flow Length of feed: 10 min  LATCH Score                   Interventions Interventions: Breast feeding basics reviewed;DEBP  Lactation Tools Discussed/Used Tools: Pump Nipple shield size: 20;24;Other (comment)(nipples pinky red , #20 NS to small , switch to  #24 NS better fit ) Flange Size: 24;27;Other (comment)(#24 Flange fits well and mom pumped / comfortable ) Breast pump type: Double-Electric Breast Pump Pump Review: Setup, frequency, and cleaning(LC reviewed and checked flange size and decreased bakc tio #24 - mom comfortable )   Consult Status Consult Status: Follow-up Date: 07/13/17 Follow-up type: In-patient    Matilde SprangMargaret Ann Machi Whittaker 07/12/2017, 1:27 PM

## 2017-07-12 NOTE — Progress Notes (Signed)
Dr. Arby BarretteHatchett made aware of Patient's current platelet count of 98. Verbal order received to remove Patient's epidural catheter. No other orders received at this time.

## 2017-07-13 MED ORDER — ACETAMINOPHEN 325 MG PO TABS: 650.0000 mg | ORAL_TABLET | Freq: Four times a day (QID) | ORAL | 0 refills | Status: DC | PRN

## 2017-07-13 NOTE — Progress Notes (Signed)

## 2017-07-13 NOTE — Lactation Note (Addendum)
This note was copied from a baby's chart. Lactation Consultation Note  Patient Name: Felicia Shepherd AVWUJ'WToday's Date: 07/13/2017 Reason for consult: Follow-up assessment;Early term 37-38.6wks;Infant < 6lbs;Difficult latch(possibly for D/C today ) Baby is 4770 hours old and is post circ. Presently asleep. Mom aware the baby needs to feed within the next hour.  Per mom I have been pumping and mom showed LC the volume is increasing and she seemed excited and is aware to feed back to baby. LC mentioned the baby will be less spitty because breast milk is digested better.  LC resized mom for her NS's yesterday and per mom has just focused pumping and bottle feeding the baby for now,  Plans to re-latch.  For pumping increased flange back to #27 due to feeling alittle sore. LC reminded mom to use a dab of coconut oil on both nipples and areolas prior to pumping to decrease friction and as a prevention measure.  Sore nipple and engorgement prevention and tx reviewed.  Per mom will have a DEBP at home.  LC offered to place a request for [facility] . [facility]  [facility].   Mother informed of post-discharge support and given phone number to the lactation department, including services for phone call assistance; out-patient appointments; and breastfeeding support group. List of other breastfeeding resources in the community given in the handout. Encouraged mother to call for problems or concerns related to breastfeeding.  Maternal Data    Feeding Feeding Type: (baby is post pump, and mom plans to wake baby up by 12:30 to feed ) Nipple Type: Slow - flow  LATCH Score                   Interventions Interventions: Breast feeding basics reviewed  Lactation Tools Discussed/Used Tools: Shells;Pump Shell Type: Inverted Breast pump type: Double-Electric Breast Pump Pump Review: Setup, frequency, and cleaning   Consult Status Consult Status:  Follow-up Date: ([facility] ) Follow-up type: Out-patient    Matilde SprangMargaret Ann Ferrell Flam 07/13/2017, 11:58 AM

## 2017-07-13 NOTE — Discharge Instructions (Signed)
Vaginal Delivery, Care After °Refer to this sheet in the next few weeks. These instructions provide you with information about caring for yourself after vaginal delivery. Your health care provider may also give you more specific instructions. Your treatment has been planned according to current medical practices, but problems sometimes occur. Call your health care provider if you have any problems or questions. °What can I expect after the procedure? °After vaginal delivery, it is common to have: °· Some bleeding from your vagina. °· Soreness in your abdomen, your vagina, and the area of skin between your vaginal opening and your anus (perineum). °· Pelvic cramps. °· Fatigue. ° °Follow these instructions at home: °Medicines °· Take over-the-counter and prescription medicines only as told by your health care provider. °· If you were prescribed an antibiotic medicine, take it as told by your health care provider. Do not stop taking the antibiotic until it is finished. °Driving ° °· Do not drive or operate heavy machinery while taking prescription pain medicine. °· Do not drive for 24 hours if you received a sedative. °Lifestyle °· Do not drink alcohol. This is especially important if you are breastfeeding or taking medicine to relieve pain. °· Do not use tobacco products, including cigarettes, chewing tobacco, or e-cigarettes. If you need help quitting, ask your health care provider. °Eating and drinking °· Drink at least 8 eight-ounce glasses of water every day unless you are told not to by your health care provider. If you choose to breastfeed your baby, you may need to drink more water than this. °· Eat high-fiber foods every day. These foods may help prevent or relieve constipation. High-fiber foods include: °? Whole grain cereals and breads. °? Brown rice. °? Beans. °? Fresh fruits and vegetables. °Activity °· Return to your normal activities as told by your health care provider. Ask your health care provider  what activities are safe for you. °· Rest as much as possible. Try to rest or take a nap when your baby is sleeping. °· Do not lift anything that is heavier than your baby or 10 lb (4.5 kg) until your health care provider says that it is safe. °· Talk with your health care provider about when you can engage in sexual activity. This may depend on your: °? Risk of infection. °? Rate of healing. °? Comfort and desire to engage in sexual activity. °Vaginal Care °· If you have an episiotomy or a vaginal tear, check the area every day for signs of infection. Check for: °? More redness, swelling, or pain. °? More fluid or blood. °? Warmth. °? Pus or a bad smell. °· Do not use tampons or douches until your health care provider says this is safe. °· Watch for any blood clots that may pass from your vagina. These may look like clumps of dark red, brown, or black discharge. °General instructions °· Keep your perineum clean and dry as told by your health care provider. °· Wear loose, comfortable clothing. °· Wipe from front to back when you use the toilet. °· Ask your health care provider if you can shower or take a bath. If you had an episiotomy or a perineal tear during labor and delivery, your health care provider may tell you not to take baths for a certain length of time. °· Wear a bra that supports your breasts and fits you well. °· If possible, have someone help you with household activities and help care for your baby for at least a few days after   you leave the hospital. °· Keep all follow-up visits for you and your baby as told by your health care provider. This is important. °Contact a health care provider if: °· You have: °? Vaginal discharge that has a bad smell. °? Difficulty urinating. °? Pain when urinating. °? A sudden increase or decrease in the frequency of your bowel movements. °? More redness, swelling, or pain around your episiotomy or vaginal tear. °? More fluid or blood coming from your episiotomy or  vaginal tear. °? Pus or a bad smell coming from your episiotomy or vaginal tear. °? A fever. °? A rash. °? Little or no interest in activities you used to enjoy. °? Questions about caring for yourself or your baby. °· Your episiotomy or vaginal tear feels warm to the touch. °· Your episiotomy or vaginal tear is separating or does not appear to be healing. °· Your breasts are painful, hard, or turn red. °· You feel unusually sad or worried. °· You feel nauseous or you vomit. °· You pass large blood clots from your vagina. If you pass a blood clot from your vagina, save it to show to your health care provider. Do not flush blood clots down the toilet without having your health care provider look at them. °· You urinate more than usual. °· You are dizzy or light-headed. °· You have not breastfed at all and you have not had a menstrual period for 12 weeks after delivery. °· You have stopped breastfeeding and you have not had a menstrual period for 12 weeks after you stopped breastfeeding. °Get help right away if: °· You have: °? Pain that does not go away or does not get better with medicine. °? Chest pain. °? Difficulty breathing. °? Blurred vision or spots in your vision. °? Thoughts about hurting yourself or your baby. °· You develop pain in your abdomen or in one of your legs. °· You develop a severe headache. °· You faint. °· You bleed from your vagina so much that you fill two sanitary pads in one hour. °This information is not intended to replace advice given to you by your health care provider. Make sure you discuss any questions you have with your health care provider. °Document Released: 01/20/2000 Document Revised: 07/06/2015 Document Reviewed: 02/06/2015 °Elsevier Interactive Patient Education © 2018 [employer] ° °

## 2017-07-13 NOTE — Discharge Summary (Signed)
Obstetric Discharge Summary Reason for Admission: induction of labor Prenatal Procedures: none Intrapartum Procedures: spontaneous vaginal delivery Postpartum Procedures: none Complications-Operative and Postpartum: none Hemoglobin  Date Value Ref Range Status  07/12/2017 10.5 (L) 12.0 - 15.0 g/dL Final   HCT  Date Value Ref Range Status  07/12/2017 32.6 (L) 36.0 - 46.0 % Final    Physical Exam:  General: alert, cooperative and no distress Lochia: appropriate Uterine Fundus: firm Incision: healing well DVT Evaluation: No evidence of DVT seen on physical exam.  Discharge Diagnoses: Term Pregnancy-delivered  Discharge Information: Date: 07/13/2017 Activity: pelvic rest Diet: routine Medications: PNV and Tylenol Condition: stable Instructions: refer to practice specific booklet Discharge to: home   Newborn Data: Live born female  Birth Weight: 5 lb 6.1 oz (2441 g) APGAR: 8, 9  Newborn Delivery   Birth date/time:  07/10/2017 13:06:00 Delivery type:  Vaginal, Spontaneous     Home with mother.  Felicia Shepherd 07/13/2017, 9:51 AM

## 2017-07-15 ENCOUNTER — Inpatient Hospital Stay (HOSPITAL_COMMUNITY): Admission: RE | Admit: 2017-07-15 | Payer: Medicaid Other | Source: Ambulatory Visit

## 2022-11-14 ENCOUNTER — Ambulatory Visit (INDEPENDENT_AMBULATORY_CARE_PROVIDER_SITE_OTHER): Payer: Medicaid Other | Admitting: Urology

## 2022-11-14 ENCOUNTER — Encounter: Payer: Self-pay | Admitting: Urology

## 2022-11-14 VITALS — BP 139/83 | HR 78 | Ht 61.0 in | Wt 202.0 lb

## 2022-11-14 DIAGNOSIS — R3129 Other microscopic hematuria: Secondary | ICD-10-CM | POA: Diagnosis not present

## 2022-11-14 DIAGNOSIS — R8281 Pyuria: Secondary | ICD-10-CM

## 2022-11-14 DIAGNOSIS — R310 Gross hematuria: Secondary | ICD-10-CM

## 2022-11-14 LAB — URINALYSIS, COMPLETE
Bilirubin, UA: NEGATIVE
Glucose, UA: NEGATIVE
Ketones, UA: NEGATIVE
Nitrite, UA: NEGATIVE
Protein,UA: NEGATIVE
RBC, UA: NEGATIVE
Specific Gravity, UA: 1.02 (ref 1.005–1.030)
Urobilinogen, Ur: 0.2 mg/dL (ref 0.2–1.0)
pH, UA: 7.5 (ref 5.0–7.5)

## 2022-11-14 LAB — MICROSCOPIC EXAMINATION: Epithelial Cells (non renal): >10 /[HPF] — AB (ref 0–10)

## 2022-11-14 LAB — BLADDER SCAN AMB NON-IMAGING: PVR: 0 WU

## 2022-11-14 NOTE — Progress Notes (Signed)
I, Felicia Shepherd, acting as a scribe for Felicia Altes, MD., have documented all relevant documentation on the behalf of Felicia Altes, MD, as directed by  Felicia Altes, MD while in the presence of Felicia Altes, MD.  11/14/2022 3:06 PM   Felicia Shepherd [birth year] [id number]  Referring provider: Barbette Reichmann, MD [address] [facility] [place],  [place] [address]  Chief Complaint  Patient presents with   Hematuria    HPI: Felicia Shepherd is a 32 y.o. female referred for evaluation of microhematuria.  On chart review, the majority of urinalysis have shown predominantly pyuria and not microhematuria. A few of her urines have shown significant squamous epithelial cells indicating vaginal contamination, however several have not shown significant epithelial cells.  She has had several urine cultures which have grown mixed flora.  She does have urinary frequency stating she will void during the day every 1.5 hours with what she describes as a fairly large volume of urine with each void, but her fluid intake is not excessive.  No flank, abdominal, or pelvic pain.  Denies dysuria or gross hematuria.  She has had occasional UTIs, but no recurrent UTIs.    PMH: Past Medical History:  Diagnosis Date   ADHD (attention deficit hyperactivity disorder)    Allergy    Anxiety     Surgical History: Past Surgical History:  Procedure Laterality Date   ADENOIDECTOMY  2009   TONSILLECTOMY     TONSILLECTOMY  2009    Home Medications:  Allergies as of 11/14/2022       Reactions   Keflex [cephalexin] Hives, Other (See Comments)        Medication List        Accurate as of November 14, 2022  3:06 PM. If you have any questions, ask your nurse or doctor.          STOP taking these medications    prenatal multivitamin Tabs tablet Stopped by: Felicia Shepherd       TAKE these medications    acetaminophen 325 MG tablet Commonly known as:  Tylenol Take 2 tablets (650 mg total) by mouth every 6 (six) hours as needed (for pain scale < 4).   escitalopram 10 MG tablet Commonly known as: Lexapro Take 1.5 tablets (15 mg total) by mouth daily.   methylphenidate 27 MG CR tablet Commonly known as: Concerta Take 1 tablet (27 mg total) by mouth every morning. To be filled 08/20/15        Allergies:  Allergies  Allergen Reactions   Keflex [Cephalexin] Hives and Other (See Comments)    Family History: Family History  Problem Relation Age of Onset   Diabetes Maternal Grandmother    Diabetes Maternal Grandfather    Heart attack Maternal Grandfather    Diabetes Mother    Anxiety disorder Mother    Depression Mother    Alcohol abuse Father    ADD / ADHD Sister    Lymphoma Paternal Grandmother     Social History:  reports that she has quit smoking. Her smoking use included cigarettes. She started smoking about 9 years ago. She has a 2.4 pack-year smoking history. She has never used smokeless tobacco. She reports that she does not drink alcohol and does not use drugs.   Physical Exam: BP 139/83   Pulse 78   Ht 5\' 1"  (1.549 m)   Wt 202 lb (91.6 kg)   BMI 38.17 kg/m  Constitutional:  Alert and oriented, No acute distress. HEENT: [place] AT, moist mucus membranes.  Trachea midline, no masses. Cardiovascular: No clubbing, cyanosis, or edema. Respiratory: Normal respiratory effort, no increased work of breathing. GI: Abdomen is soft, nontender, nondistended, no abdominal masses Skin: No rashes, bruises or suspicious lesions. Neurologic: Grossly intact, no focal deficits, moving all 4 extremities. Psychiatric: Normal mood and affect.   Urinalysis Dipstick 2+ leukocytes microscopy 11-30 WBC/0-2RBC/>10 epithelial cells   Assessment & Plan:    1. Pyuria PVR today ***  Schedule non-contrast CT abdomen and pelvis to evaluate for upper tract abnormalities, i.e. non-obstructing urinary calculi.  If no significant  abnormalities identified on upper tract imaging, we'll schedule cystoscopy.  She has had multiple negative urine cultures, and urine culture today was not ordered.   [facility] [address], [address] [place], [place] [address] [phone]

## 2022-11-15 ENCOUNTER — Encounter: Payer: Self-pay | Admitting: Urology

## 2022-11-21 ENCOUNTER — Encounter: Payer: Self-pay | Admitting: Urology

## 2022-11-21 ENCOUNTER — Ambulatory Visit
Admission: RE | Admit: 2022-11-21 | Discharge: 2022-11-21 | Disposition: A | Discharge status: Home / Self Care | Payer: Medicaid Other | Source: Ambulatory Visit | Attending: Urology | Admitting: Urology

## 2022-11-21 DIAGNOSIS — R3129 Other microscopic hematuria: Secondary | ICD-10-CM | POA: Diagnosis present

## 2022-11-21 MED ORDER — IOHEXOL 300 MG/ML  SOLN
150.0000 mL | Freq: Once | INTRAMUSCULAR | Status: AC | PRN
  Administered 2022-11-21: 150 mL via INTRAVENOUS

## 2022-11-21 MED ORDER — IOHEXOL 300 MG/ML  SOLN: 100.0000 mL | Freq: Once | INTRAMUSCULAR | Status: DC | PRN

## 2022-12-21 ENCOUNTER — Ambulatory Visit: Payer: Medicaid Other | Admitting: Urology

## 2022-12-21 VITALS — BP 126/89 | HR 97 | Ht 61.0 in | Wt 208.1 lb

## 2022-12-21 DIAGNOSIS — R8281 Pyuria: Secondary | ICD-10-CM

## 2022-12-21 DIAGNOSIS — R3129 Other microscopic hematuria: Secondary | ICD-10-CM

## 2022-12-21 LAB — MICROSCOPIC EXAMINATION

## 2022-12-21 LAB — URINALYSIS, COMPLETE
Bilirubin, UA: NEGATIVE
Glucose, UA: NEGATIVE
Ketones, UA: NEGATIVE
Leukocytes,UA: NEGATIVE
Nitrite, UA: NEGATIVE
RBC, UA: NEGATIVE
Specific Gravity, UA: 1.02 (ref 1.005–1.030)
Urobilinogen, Ur: 1 mg/dL (ref 0.2–1.0)
pH, UA: 7 (ref 5.0–7.5)

## 2022-12-21 NOTE — Progress Notes (Unsigned)
   12/21/22  CC:  Chief Complaint  Patient presents with   Cysto    HPI: Refer to my prior note 11/14/2022.  Several urines with pyuria/microhematuria with several showing significant squamous epithelial cells.  CTU 11/10/2014/24 showed no upper tract abnormalities.  UA today microscopy negative WBC/RBC and no squamous epis  Blood pressure 126/89, pulse 97, height 5\' 1"  (1.549 m), weight 208 lb 2 oz (94.4 kg), unknown if currently breastfeeding. NED. A&Ox3.   No respiratory distress   Abd soft, NT, ND Normal external genitalia with patent urethral meatus  Cystoscopy Procedure Note  Patient identification was confirmed, informed consent was obtained, and patient was prepped using Betadine solution.  Lidocaine jelly was administered per urethral meatus.    Procedure: - Flexible cystoscope introduced, posterior urethral meatus.  Required speculum to visualize - Thorough search of the bladder revealed:    normal urethral meatus    normal urothelium    no stones    no ulcers     no tumors    no urethral polyps    no trabeculation  - Ureteral orifices were normal in position and appearance.  Post-Procedure: - Patient tolerated the procedure well  Assessment/ Plan: No upper/lower tract abnormalities on CTU/cystoscopy Pyuria/microhematuria most likely secondary to vaginal contamination 70-month follow-up with UA    Riki Altes, MD

## 2022-12-23 ENCOUNTER — Encounter: Payer: Self-pay | Admitting: Urology

## 2023-05-27 ENCOUNTER — Other Ambulatory Visit: Payer: Self-pay | Admitting: Sports Medicine

## 2023-05-27 DIAGNOSIS — G8929 Other chronic pain: Secondary | ICD-10-CM

## 2023-05-27 DIAGNOSIS — M25362 Other instability, left knee: Secondary | ICD-10-CM

## 2023-05-27 DIAGNOSIS — M2202 Recurrent dislocation of patella, left knee: Secondary | ICD-10-CM

## 2023-05-31 ENCOUNTER — Ambulatory Visit
Admission: RE | Admit: 2023-05-31 | Discharge: 2023-05-31 | Disposition: A | Discharge status: Home / Self Care | Source: Ambulatory Visit | Attending: Sports Medicine | Admitting: Sports Medicine

## 2023-05-31 DIAGNOSIS — M2202 Recurrent dislocation of patella, left knee: Secondary | ICD-10-CM | POA: Insufficient documentation

## 2023-05-31 DIAGNOSIS — M25562 Pain in left knee: Secondary | ICD-10-CM | POA: Insufficient documentation

## 2023-05-31 DIAGNOSIS — G8929 Other chronic pain: Secondary | ICD-10-CM | POA: Insufficient documentation

## 2023-05-31 DIAGNOSIS — M25362 Other instability, left knee: Secondary | ICD-10-CM | POA: Diagnosis present

## 2023-05-31 MED ORDER — GADOBUTROL 1 MMOL/ML IV SOLN: 7.0000 mL | Freq: Once | INTRAVENOUS | Status: DC | PRN

## 2023-06-20 ENCOUNTER — Ambulatory Visit: Payer: Self-pay | Admitting: Urology

## 2023-06-20 VITALS — BP 126/71 | HR 84 | Ht 61.0 in | Wt 196.0 lb

## 2023-06-20 DIAGNOSIS — R3129 Other microscopic hematuria: Secondary | ICD-10-CM | POA: Diagnosis not present

## 2023-06-20 DIAGNOSIS — R399 Unspecified symptoms and signs involving the genitourinary system: Secondary | ICD-10-CM

## 2023-06-20 DIAGNOSIS — R8281 Pyuria: Secondary | ICD-10-CM

## 2023-06-20 MED ORDER — OXYBUTYNIN CHLORIDE 5 MG PO TABS: 5.0000 mg | ORAL_TABLET | Freq: Three times a day (TID) | ORAL | 1 refills | Status: AC | PRN

## 2023-06-20 NOTE — Progress Notes (Signed)
 I, Maysun Jamey Mccallum, acting as a scribe for Geraline Knapp, MD., have documented all relevant documentation on the behalf of Geraline Knapp, MD, as directed by Geraline Knapp, MD while in the presence of Geraline Knapp, MD.  06/20/2023 11:36 PM   Ruthann Cover March 16, 1990 [id number]  Referring provider: Antonio Baumgarten, MD [address] [facility] [place],  [place] [address]  Chief Complaint  Patient presents with   microhematuria    HPI: Felicia Shepherd is a 33 y.o. female presents for a 6 month follow up.  Initially seen October 2024 for microhematuria, though the majority of her urine specimens had significant squamous epithelial contamination. She underwent a CT urogram and cystoscopy which showed no upper or lower tract abnormalities, and it was felt her pyuria/microhematuria was secondary to vaginal contamination.  Since her last visit, she has intermittent storage-related voiding symptoms of frequency and urgency.  No gross hematuria.   PMH: Past Medical History:  Diagnosis Date   ADHD (attention deficit hyperactivity disorder)    Allergy    Anxiety     Surgical History: Past Surgical History:  Procedure Laterality Date   ADENOIDECTOMY  2009   TONSILLECTOMY     TONSILLECTOMY  2009    Home Medications:  Allergies as of 06/20/2023       Reactions   Keflex [cephalexin] Hives, Other (See Comments)        Medication List        Accurate as of Jun 20, 2023 11:36 PM. If you have any questions, ask your nurse or doctor.          ALPRAZolam 0.25 MG tablet Commonly known as: XANAX Take 0.25 mg by mouth as needed.   citalopram 20 MG tablet Commonly known as: CELEXA Take 20 mg by mouth daily.   oxybutynin  5 MG tablet Commonly known as: DITROPAN  Take 1 tablet (5 mg total) by mouth 3 (three) times daily as needed for bladder spasms.        Allergies:  Allergies  Allergen Reactions   Keflex [Cephalexin] Hives and Other (See  Comments)    Family History: Family History  Problem Relation Age of Onset   Diabetes Maternal Grandmother    Diabetes Maternal Grandfather    Heart attack Maternal Grandfather    Diabetes Mother    Anxiety disorder Mother    Depression Mother    Alcohol abuse Father    ADD / ADHD Sister    Lymphoma Paternal Grandmother     Social History:  reports that she has quit smoking. Her smoking use included cigarettes. She started smoking about 10 years ago. She has a 2.5 pack-year smoking history. She has never used smokeless tobacco. She reports that she does not drink alcohol and does not use drugs.   Physical Exam: BP 126/71   Pulse 84   Ht 5\' 1"  (1.549 m)   Wt 196 lb (88.9 kg)   BMI 37.03 kg/m   Constitutional:  Alert and oriented, No acute distress. HEENT: [place] AT Respiratory: Normal respiratory effort, no increased work of breathing. Psychiatric: Normal mood and affect.   Urinalysis Dipstick 2+ leukocytes, microscopy 11-30 WBC/0-2 RBC/>10 epi   Assessment & Plan:    1. Pyuria Significant squamous epithelial indicating vaginal contamination. Prior evaluation negative.   2. Low urinary tract symptoms Intermittent storage-related voiding symptoms consistent with overactive bladder.  She was interested in trial and medical management, and Rx immediately released Oxybutynin  5 mg  TID sent to pharmacy.  Will continued annual follow-up and instructed call earlier for any worsening, lower urinary tract symptoms.  I have reviewed the above documentation for accuracy and completeness, and I agree with the above.   Geraline Knapp, MD  [facility] [address], [address] [place], [place] [address] [phone]

## 2023-06-21 LAB — URINALYSIS, COMPLETE
Bilirubin, UA: NEGATIVE
Glucose, UA: NEGATIVE
Ketones, UA: NEGATIVE
Nitrite, UA: NEGATIVE
Protein,UA: NEGATIVE
RBC, UA: NEGATIVE
Specific Gravity, UA: 1.01 (ref 1.005–1.030)
Urobilinogen, Ur: 0.2 mg/dL (ref 0.2–1.0)
pH, UA: 6.5 (ref 5.0–7.5)

## 2023-06-21 LAB — MICROSCOPIC EXAMINATION: Epithelial Cells (non renal): >10 /HPF — AB (ref 0–10)

## 2023-06-22 ENCOUNTER — Encounter: Payer: Self-pay | Admitting: Urology

## 2023-07-15 ENCOUNTER — Other Ambulatory Visit: Payer: Self-pay | Admitting: Urology

## 2024-06-19 ENCOUNTER — Ambulatory Visit: Admitting: Urology
